# Patient Record
Sex: Female | Born: 1940 | Race: White | Hispanic: No | Marital: Married
Health system: Midwestern US, Academic
[De-identification: ages and names within clinical notes are randomized; demographics above are authoritative.]

---

## 2017-01-30 ENCOUNTER — Encounter: Admit: 2017-01-30 | Discharge: 2017-01-30 | Payer: MEDICARE

## 2017-01-30 DIAGNOSIS — Z1239 Encounter for other screening for malignant neoplasm of breast: Principal | ICD-10-CM

## 2017-02-10 ENCOUNTER — Encounter: Admit: 2017-02-10 | Discharge: 2017-02-10 | Payer: MEDICARE

## 2017-02-10 ENCOUNTER — Ambulatory Visit: Admit: 2017-02-10 | Discharge: 2017-02-10 | Payer: MEDICARE

## 2017-02-10 DIAGNOSIS — Z1231 Encounter for screening mammogram for malignant neoplasm of breast: Principal | ICD-10-CM

## 2018-02-15 ENCOUNTER — Ambulatory Visit: Admit: 2018-02-15 | Discharge: 2018-02-15 | Payer: MEDICARE

## 2018-02-15 DIAGNOSIS — Z1231 Encounter for screening mammogram for malignant neoplasm of breast: Principal | ICD-10-CM

## 2020-03-10 ENCOUNTER — Encounter: Admit: 2020-03-10 | Discharge: 2020-03-10 | Payer: MEDICARE

## 2020-03-10 DIAGNOSIS — Z1231 Encounter for screening mammogram for malignant neoplasm of breast: Secondary | ICD-10-CM

## 2020-03-30 ENCOUNTER — Encounter: Admit: 2020-03-30 | Discharge: 2020-03-30 | Payer: MEDICARE

## 2020-03-30 ENCOUNTER — Ambulatory Visit: Admit: 2020-03-30 | Discharge: 2020-03-30 | Payer: MEDICARE

## 2020-03-30 DIAGNOSIS — Z1231 Encounter for screening mammogram for malignant neoplasm of breast: Secondary | ICD-10-CM

## 2021-05-03 ENCOUNTER — Encounter: Admit: 2021-05-03 | Discharge: 2021-05-03 | Payer: MEDICARE

## 2021-05-04 ENCOUNTER — Encounter: Admit: 2021-05-04 | Discharge: 2021-05-04 | Payer: MEDICARE

## 2021-05-04 ENCOUNTER — Ambulatory Visit: Admit: 2021-05-04 | Discharge: 2021-05-04 | Payer: MEDICARE

## 2021-05-04 DIAGNOSIS — Z1231 Encounter for screening mammogram for malignant neoplasm of breast: Secondary | ICD-10-CM

## 2021-05-07 ENCOUNTER — Encounter: Admit: 2021-05-07 | Discharge: 2021-05-07 | Payer: MEDICARE

## 2021-07-20 ENCOUNTER — Encounter: Admit: 2021-07-20 | Discharge: 2021-07-20 | Payer: MEDICARE

## 2021-07-20 NOTE — Progress Notes
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 No rupture.   Labs:  pending  Meds:  Zosyn. Fluids.   Vitals:  169/85  80  20  99.0  99%RA  AOx4   Hx:  DJD.  RLE lymphedema     Reason for Transfer:  No surgeon in house. Needs IR vs surgery.  [facility] at capacity.

## 2022-05-26 ENCOUNTER — Ambulatory Visit: Admit: 2022-05-26 | Discharge: 2022-05-26 | Payer: MEDICARE

## 2022-05-26 ENCOUNTER — Encounter: Admit: 2022-05-26 | Discharge: 2022-05-26 | Payer: MEDICARE

## 2022-05-26 DIAGNOSIS — Z1231 Encounter for screening mammogram for malignant neoplasm of breast: Secondary | ICD-10-CM

## 2022-06-13 ENCOUNTER — Encounter: Admit: 2022-06-13 | Discharge: 2022-06-13 | Payer: MEDICARE

## 2022-06-14 ENCOUNTER — Encounter: Admit: 2022-06-14 | Discharge: 2022-06-14 | Payer: MEDICARE

## 2022-06-14 DIAGNOSIS — R0989 Other specified symptoms and signs involving the circulatory and respiratory systems: Secondary | ICD-10-CM

## 2022-06-14 DIAGNOSIS — I10 Essential (primary) hypertension: Secondary | ICD-10-CM

## 2022-06-14 DIAGNOSIS — E785 Hyperlipidemia, unspecified: Secondary | ICD-10-CM

## 2022-06-14 DIAGNOSIS — I4891 Unspecified atrial fibrillation: Secondary | ICD-10-CM

## 2022-06-14 DIAGNOSIS — I890 Lymphedema, not elsewhere classified: Secondary | ICD-10-CM

## 2022-06-14 MED ORDER — BUMETANIDE 1 MG PO TAB
1 mg | ORAL_TABLET | Freq: Two times a day (BID) | ORAL | 1 refills | Status: AC
Start: 2022-06-14 — End: ?

## 2022-06-14 NOTE — Progress Notes
Cardiovascular Medicine       Date of Service: 06/14/2022      HPI     Susan Bailey is a 82 y.o. female who was seen today in the Cardiovascular Medicine Clinic at [facility] at our [place] office.     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    She presents for evaluation of recently diagnosed atrial fibrillation and bilateral lower extremity edema.  She states that she was in her usual state of health until Christmas when she developed COVID.  She has noted worsening bilateral lower extremity edema and a 10 pound weight gain in the past few weeks.  She went to her primary care physician who ordered an EKG and it showed atrial fibrillation.  She was prescribed furosemide and apixaban.  She also takes carvedilol and losartan.    She denies any palpitations, lightheadedness, orthopnea, paroxysmal nocturnal dyspnea.  She does have significant bilateral lower extremity edema and her right leg is much more edematous than her left leg.  She reports that she had cellulitis in her right leg few years back and she has had significant lower extremity swelling in that leg since.  She does not report any significant shortness of breath but does state [facility] in the past few weeks.  She has significant bilateral knee arthritis and this limits her activity.  She does not have any known history of cardiac disease.  She had a coronary artery calcium score in October 2023 that was normal.  She reports that she has EKGs last year during surgeries in May and June and atrial fibrillation was not detected.                   Assessment & Plan   82 y.o. female patient with the following medical problems:    Atrial fibrillation.  Hypertension.  Hyperlipidemia.  Lower extremity edema.    Her atrial fibrillation is rate controlled on carvedilol.  Will continue same.  Continue apixaban for thromboembolic stroke prevention.  Will obtain echocardiogram.  Will have her return to clinic in 4 weeks.  I discussed with her the indication for pursuing rhythm control, especially if she has heart failure.  She started anticoagulation only a few days back so if he were to pursue cardioversion, would recommend waiting 3 to 4 weeks, given that she is stable.  At her clinic visit in 4 weeks, we will discuss possible cardioversion fondled by antiarrhythmic initiation based on the results of the echocardiogram.  Will discontinue Lasix as patient states that she does not diurese significantly with this.  Will switch to Bumex 1 mg twice daily.  Will recheck comprehensive metabolic panel in 1 week.  Patient had mild elevation in AST and ALT at prior check, this may be secondary to congestive hepatopathy, will reassess after diuresis and if significantly elevated, will refer to primary care physician so a liver ultrasound can be arranged.  Will arrange for sleep study to evaluate for any obstructive sleep apnea.    Return to clinic in 4 weeks         Past Medical History  Patient Active Problem List    Diagnosis Date Noted    Hyperlipidemia, unspecified 06/14/2022    Hypertension 06/14/2022    Lymphedema 06/14/2022    Other chronic pain  06/14/2022    Vitamin D deficiency, unspecified 06/14/2022    Atrial fibrillation (HCC) 06/09/2022    Other screening mammogram 05/25/2007       I reviewed and confirmed this patient's problem list, active medications, allergies, and past medical, social, family & tobacco histories.     Review of Systems  14 point review of systems negative except as above.    Vitals:    06/14/22 1040   BP: 132/74   BP Source: Arm, Left Upper   Pulse: 75   SpO2: 98%   O2 Device: None (Room air)   PainSc: Zero   Weight: 82.1 kg (181 lb)   Height: 167.6 cm (5' 6)     Body mass index is 29.21 kg/m?Marland Kitchen Physical Exam  General Appearance: no acute distress  HEENT: EOMI, mucous membranes moist, oropharynx is clear  Neck Veins: Regular venous distention 6 cm above the suprasternal notch.    Carotid Arteries: no bruits  Chest Inspection: chest is normal in appearance  Auscultation/Percussion: lungs clear to auscultation, no rales, rhonchi, or wheezing  Cardiac Rhythm: Irregularly irregular.   Murmurs: no cardiac murmurs  Abdominal Exam: soft, non-tender, normal bowel sounds, no masses or bruits  Abdominal aorta: nonpalpable   Liver & Spleen: no organomegaly  Extremities: 4+ pitting edema in the right lower extremity, 3+ pitting edema in the left lower extremity.  Skin: warm & intact  Neurologic Exam: oriented to time, place and person; no focal neurologic deficits       Cardiovascular Studies        Cardiovascular Health Factors  Vitals BP Readings from Last 3 Encounters:   06/14/22 132/74     Wt Readings from Last 3 Encounters:   06/14/22 82.1 kg (181 lb)   05/26/22 81.2 kg (179 lb)     BMI Readings from Last 3 Encounters:   06/14/22 29.21 kg/m?   05/26/22 28.89 kg/m?      Smoking Social History     Tobacco Use   Smoking Status Never   Smokeless Tobacco Never      Lipid Profile Cholesterol   Date Value Ref Range Status   01/18/2022 195  Final     HDL   Date Value Ref Range Status   01/18/2022 66  Final     LDL   Date Value Ref Range Status   01/18/2022 116 (H) <100 Final     Triglycerides   Date Value Ref Range Status   01/18/2022 66  Final      Blood Sugar No results found for: HGBA1C  Glucose   Date Value Ref Range Status   06/13/2022 88  Final        ASCVD Risk Assessment:     ASCVD 10-year risk calculated: The 10-year ASCVD risk score (Arnett DK, et al., 2019) is: 33.7%*    Values used to calculate the score:      Age: 41 years      Sex: Female      Is Non-Hispanic African American: No      Diabetic: No      Tobacco smoker: No      Systolic Blood Pressure: 132 mmHg      Is BP treated: Yes      HDL Cholesterol: 66 mg/dL*      Total Cholesterol: 195 mg/dL*      * - Cholesterol units were assumed for this score calculation     LDL 70-189, if ASCVD 10-y risk is >7.5%, high to moderate-intensity  statin therapy is recommended  Diabetes with ASCVD 10-y risk >7.5%, high-intensity statin therapy is recommended.  Diabetes with ASCVD 10-y risk <7.5%, moderate-intensity statin therapy is recommended.      Current Medications (including today's revisions)   alendronate (FOSAMAX) 70 mg tablet Take one tablet by mouth every 7 days.    carvediloL (COREG) 6.25 mg tablet Take one tablet by mouth twice daily.    CHOLEcalciferoL (vitamin D3) (OPTIMAL D3) 50,000 units capsule Take one capsule by mouth every 7 days.    ELIQUIS 5 mg tablet Take one tablet by mouth twice daily.    estradioL (CLIMARA) 0.025 mg/24 hr patch Apply one patch to top of skin as directed every 7 days.    furosemide (LASIX) 20 mg tablet Take two tablets by mouth twice daily.    Geriatric Multivit w/Iron-Min tab Take 1 tablet by mouth daily.    losartan (COZAAR) 50 mg tablet Take one tablet by mouth daily.    potassium chloride (KLOR-CON 10) 10 mEq tablet Take one tablet by mouth twice daily.    sodium chloride (SEA MIST) 0.65 % nasal spray Apply two sprays into nose as directed every 4 hours as needed.         Orpah Cobb MD  Cardiovascular Medicine.

## 2022-06-14 NOTE — Patient Instructions
Sleep study  Bumex 1 mg twice daily 7am 2pm  Lab in one week   Echo    Follow up as directed.  Call sooner if issues.  Call the [place] line at [phone].  Leave a detailed message for the nurse in [place] with how we can assist you and we will call you back.

## 2022-06-20 ENCOUNTER — Encounter: Admit: 2022-06-20 | Discharge: 2022-06-20 | Payer: MEDICARE

## 2022-06-20 MED ORDER — BUMETANIDE 1 MG PO TAB
1 mg | ORAL_TABLET | Freq: Two times a day (BID) | ORAL | 1 refills | Status: AC
Start: 2022-06-20 — End: ?

## 2022-06-20 NOTE — Telephone Encounter
06/20/22 @ 1136am- Call placed to pt, verified name and DOB. Reviewed the below results/recommendations from Dr. Cephus Slater, pt states she has a lab order entered to be done this week at [facility], with chart review noted CMP was ordered. Instructed pt to call office if weight increase and she increases Bumex dose so medication list can be updated, pt v/u and denies any further questions/concerns at this time.         ===View-only below this line===  ----- Message -----  From: Lurline Del, MD  Sent: 06/20/2022   9:55 AM CST  To: Luretha Murphy, RN; *    If her breathing is stable, we can continue to monitor on the current dose of Bumex.  She has lost about 5 to 6 pounds.  Please obtain a repeat BMP.  If weight continues to go up instead of staying stable, she can take 2 mg of Bumex in the morning and 1 mg in the afternoon for a total of 3 mg daily.    Thanks.  Swathi

## 2022-06-20 NOTE — Telephone Encounter
06/20/22 @ 0906am- Received VM on nurse line from pt requesting a return call back to discuss bumex and potassium that she was started on by Dr. Cephus Slater and she is wondering if she still needs to take the medication as her swelling as improved.    06/20/22 @ 0930am- Call placed to pt, verified name and DOB. Reviewed VM received and obtained the below weight log, pt reports that she felt Bumex was working however states her weight is going back up, so she is questioning if medication needs to be adjusted, instructed pt that information will be routed to Dr. Cephus Slater for review/advise and will return call with further recommendations, pt v/u and denies any further questions/concerns at this time.     06/14/22- 181 lbs  06/15/22-176.6 lbs  06/16/22- 176.2 lbs  06/17/22- 176.2 bs   06/18/22- 177.4 lbs  06/19/22- not taken   06/20/22- 178.2 lbs       Routed to Dr. Cephus Slater and Kathe Mariner nurse team.

## 2022-06-23 ENCOUNTER — Ambulatory Visit: Admit: 2022-06-23 | Discharge: 2022-06-23 | Payer: MEDICARE

## 2022-06-23 ENCOUNTER — Encounter: Admit: 2022-06-23 | Discharge: 2022-06-23 | Payer: MEDICARE

## 2022-06-23 DIAGNOSIS — I10 Essential (primary) hypertension: Secondary | ICD-10-CM

## 2022-06-23 DIAGNOSIS — R0989 Other specified symptoms and signs involving the circulatory and respiratory systems: Secondary | ICD-10-CM

## 2022-06-23 DIAGNOSIS — E785 Hyperlipidemia, unspecified: Secondary | ICD-10-CM

## 2022-06-23 DIAGNOSIS — I4891 Unspecified atrial fibrillation: Secondary | ICD-10-CM

## 2022-06-23 DIAGNOSIS — I890 Lymphedema, not elsewhere classified: Secondary | ICD-10-CM

## 2022-06-23 LAB — COMPREHENSIVE METABOLIC PANEL
ALBUMIN: 3.5
ALK PHOSPHATASE: 40
ALT: 31
ANION GAP: 13
AST: 17
BLD UREA NITROGEN: 29 — ABNORMAL HIGH (ref 9.8–20.1)
CALCIUM: 8.9
CHLORIDE: 103
CO2: 25
CREATININE: 0.9
GLUCOSE,PANEL: 105
POTASSIUM: 4.1
SODIUM: 141
TOTAL BILIRUBIN: 0.6
TOTAL PROTEIN: 6 — ABNORMAL LOW (ref 6.2–8.1)

## 2022-06-24 ENCOUNTER — Encounter: Admit: 2022-06-24 | Discharge: 2022-06-24 | Payer: MEDICARE

## 2022-06-24 NOTE — Telephone Encounter
Lurline Del, MD  Baldwin Crown, RN  Caller: Unspecified (Today,  9:35 AM)  Thank you for letting me know.  Please let her know I hope she gets over her diverticulitis soon.  No new changes for her diuretic regimen, I agree with her taking additional doses of Bumex as needed to maintain a weight between 170 to 175 pounds.  Please have her call us if she has any worsening shortness of breath/swelling/weight gain that is not responding to diuresis.

## 2022-06-24 NOTE — Telephone Encounter
Susan Bailey called into the nursing line to report that she took an extra dose of bumex this morning.  Susan Bailey states that her weight went down to 176 on 2/2.  On 2/3 she started having a flare up of her diverticulitis.  He was was up, but by 2/6, her weight was back down to 174.8.  Her weight today is and yesterday was up to 175.2 and so she took an extra bumex this morning to try and help get her weight back down to her target.    Susan Bailey states that she is feeling okay overall.  She is still dealing with the diverticulitis, but has been working with her PCP and is trying to get back to her normal diet.  She states that her swelling has improved - especially in the morning, but she will have some swelling in the evening after taking off her compression socks.  Her BP is running around 117.89, HR around 97.    Will route to [facility] for review and recommendations.

## 2022-06-27 ENCOUNTER — Encounter: Admit: 2022-06-27 | Discharge: 2022-06-27 | Payer: MEDICARE

## 2022-06-27 NOTE — Telephone Encounter
Sent mychart message with results and recommendations.

## 2022-06-27 NOTE — Telephone Encounter
-----   Message from Trenton Founds, [place] sent at 06/27/2022 12:40 PM CST -----    ----- Message -----  From: Lurline Del, MD  Sent: 06/27/2022  12:38 PM CST  To: Asencion Noble; Cvm Nurse Gen Card Team Gold    Please let her know that her kidney and liver function tests were within normal range.   No new medication recommendations at this time.     Thanks  Swathi  ----- Message -----  From: Asencion Noble  Sent: 06/23/2022   2:02 PM CST  To: Lurline Del, MD    Labs for your review and recommendations.

## 2022-07-07 ENCOUNTER — Encounter: Admit: 2022-07-07 | Discharge: 2022-07-07 | Payer: MEDICARE

## 2022-07-07 DIAGNOSIS — I10 Essential (primary) hypertension: Secondary | ICD-10-CM

## 2022-07-07 DIAGNOSIS — R0989 Other specified symptoms and signs involving the circulatory and respiratory systems: Secondary | ICD-10-CM

## 2022-07-07 DIAGNOSIS — B999 Unspecified infectious disease: Secondary | ICD-10-CM

## 2022-07-07 DIAGNOSIS — I4891 Unspecified atrial fibrillation: Secondary | ICD-10-CM

## 2022-07-07 DIAGNOSIS — Z9229 Personal history of other drug therapy: Secondary | ICD-10-CM

## 2022-07-07 DIAGNOSIS — E785 Hyperlipidemia, unspecified: Secondary | ICD-10-CM

## 2022-07-07 DIAGNOSIS — M1990 Unspecified osteoarthritis, unspecified site: Secondary | ICD-10-CM

## 2022-07-07 DIAGNOSIS — I890 Lymphedema, not elsewhere classified: Secondary | ICD-10-CM

## 2022-07-07 DIAGNOSIS — Z780 Asymptomatic menopausal state: Secondary | ICD-10-CM

## 2022-07-07 NOTE — Patient Instructions
Thank you for visiting our office today.    We would like to make the following medication adjustments:  None       Otherwise continue the same medications as you have been doing.          We will be pursuing the following tests after your appointment today:       Orders Placed This Encounter    ECG 12-LEAD     Tikosyn Admission    We will plan to see you back in 3 months.  Please call us in the meantime with any questions or concerns.        Please allow 5-7 business days for our providers to review your results. All normal results will go to MyChart. If you do not have Mychart, it is strongly recommended to get this so you can easily view all your results. If you do not have mychart, we will attempt to call you once with normal lab and testing results. If we cannot reach you by phone with normal results, we will send you a letter.  If you have not heard the results of your testing after one week please give [place] a call.       Your Cardiovascular Medicine [place] Team Richardson Landry, Rene Kocher, Threasa Beards, and [place])  phone number is [phone].

## 2022-07-07 NOTE — Progress Notes
Cardiovascular Medicine       Date of Service: 07/07/2022      HPI     Susan Bailey is a 82 y.o. female who was seen today in the Cardiovascular Medicine Clinic at [facility] at our [place] office.     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     She presented for evaluation of recently diagnosed atrial fibrillation and bilateral lower extremity edema.  She states that she was in her usual state of health until Christmas when she developed COVID.  She has noted worsening bilateral lower extremity edema and a 10 pound weight gain in the past few weeks.  She went to her primary care physician who ordered an EKG and it showed atrial fibrillation.  She was prescribed furosemide and apixaban.  She also takes carvedilol and losartan.     She denies any palpitations, lightheadedness, orthopnea, paroxysmal nocturnal dyspnea.  She does have significant bilateral lower extremity edema and her right leg is much more edematous than her left leg.  She reports that she had cellulitis in her right leg few years back and she has had significant lower extremity swelling in that leg since.  She does not report any significant shortness of breath but does state [facility] in the past few weeks.  She has significant bilateral knee arthritis and this limits her activity.  She does not have any known history of cardiac disease.  She had a coronary artery calcium score in October 2023 that was normal.  She reports that she has EKGs last year during surgeries in May and June and atrial fibrillation was not detected.    She continues to be in atrial fibrillation today.  She states that she has noticed a slight increase in her fatigue and exertional dyspnea for the past few weeks.  She has tolerated the apixaban well without any significant bleeding.  I discussed a cardioversion with her with initiation of antiarrhythmic after that.  She and her husband are agreeable.           ECG: Atrial fibrillation.    Transthoracic echocardiogram 06/2022:  Normal left ventricular cavitary dimensions with concentric remodeling  Normal left ventricular systolic function with a visually estimated ejection fraction of ~55-60 %.  Normal right ventricular cavitary size with normal RV systolic function.  Unable to determine LV diastolic dysfunction secondary to underlying atrial fibrillation.  No left ventricular focal regional wall motion abnormality.  No chamber enlargement.  No hemodynamically significant valvular stenosis  Mild mitral, aortic and moderate tricuspid valve insufficiency.  Estimated PA systolic pressure is 28 mmHg  No pericardial effusion.     Stress test:        Assessment & Plan   82 y.o. female patient with the following medical problems:    Atrial fibrillation.  Hypertension.  Hyperlipidemia.  Lower extremity edema.     ECG continues to show atrial fibrillation. Continue apixaban for thromboembolic stroke prevention.  Continue carvedilol, losartan and supplemental potassium.  Transthoracic echocardiogram showed preserved left ventricular systolic function.  Will plan for cardioversion followed by dofetilide initiation.  Discussed the procedure with the patient and she is agreeable to the same.  Continue diuresis with Bumex.  Please clinic visit had some mild elevation in AST and ALT but this has since resolved.  Her TSH was normal.  Will arrange for sleep study to evaluate for any obstructive sleep apnea.     Return to clinic in 3 months.         Past Medical History  Patient Active Problem List    Diagnosis Date Noted    Hyperlipidemia, unspecified 06/14/2022    Hypertension 06/14/2022    Lymphedema 06/14/2022    Other chronic pain 06/14/2022    Vitamin D deficiency, unspecified 06/14/2022    Atrial fibrillation (HCC) 06/09/2022    Other screening mammogram 05/25/2007       I reviewed and confirmed this patient's problem list, active medications, allergies, and past medical, social, family & tobacco histories.     Review of Systems  14 point review of systems negative except as above.    There were no vitals filed for this visit.  There is no height or weight on file to calculate BMI.     Physical Exam  General Appearance: no acute distress  HEENT: EOMI, mucous membranes moist, oropharynx is clear  Neck Veins: neck veins are flat & not distended  Carotid Arteries: no bruits  Chest Inspection: chest is normal in appearance  Auscultation/Percussion: lungs clear to auscultation, no rales, rhonchi, or wheezing  Cardiac Rhythm: regular rhythm & normal rate  Cardiac Auscultation: Normal S1 & S2, no S3 or S4, no rub  Murmurs: no cardiac murmurs  Abdominal Exam: soft, non-tender, normal bowel sounds, no masses or bruits  Abdominal aorta: nonpalpable   Liver & Spleen: no organomegaly  Extremities: no lower extremity edema; palpable distal pulses  Skin: warm & intact  Neurologic Exam: oriented to time, place and person; no focal neurologic deficits       Cardiovascular Studies  06/23/22   2D + DOPPLER ECHO   Result Value Ref Range    BSA 1.86 m2    Referring Provider Voshon Petro     LVIDD 4.4 3.8 - 5.2 cm    LVIDS 2.5 2.2 - 3.5 cm    IVS 1.0 0.6 - 0.9 cm    PW 1.0 0.6 - 0.9 cm    FS 43.18 28 - 44 %    Teich[place]oltz 73.41 %    LA size 3.9 2.7 - 3.8 cm    Left Ventricle Systolic Volume 30 14 - 42 mL    Left Ventricle Systolic Volume Index 16 8 - 24 mL/m2    Left Ventricle Diastolic Volume 65 46 - 106 mL    Left Ventricle Diastolic Volume Index 35 29 - 61 mL/m2    LA volume 61 22 - 52 mL    Left Atrium Index 32.80 16 - 34 mL/m2    LV mass 148 67 - 162 g    Left Ventricle Mass Index 79 43 - 95 g/m2    RWT 0.45 <=0.42    Right Ventricular Basal Diameter 4.2 2.5 - 4.1 cm    Right Atrial Area 18.0 <18 cm2    Right Ventricular Mid Diameter 2.6 1.9 - 3.5 cm    Right Heart Systolic Mmode TAPSE 1.6 >1.7 cm    Sinus 2.7 2.4 - 3.6 cm    Proximal aorta 2.9 1.9 - 3.5 cm    LVOT diameter 1.9 cm    LVOT area 2.84 cm2    LVOT peak VTI 18.0 cm    LVOT stroke volume 51.04 cm3    MV Comp diameter 1.9 cm    MV Comp area 2.84 cm2    MV Incomp  diameter 3.2 cm    MV Incomp area 8.04 cm2    TR PEAK VELOCITY 2.5 m/s    RV SYSTOLIC PRESSURE 25     RA PRESSURE 3     TV rest pulmonary artery pressure 28 mmHg    TDI Medial e' 0.080 m/s    Medial E/E' ratio 12.50     TDI lateral e' 0.120 m/s    Lateral E/E' ratio 8.33     MV Peak E Vel PW 1.000 m/s    Right Heart Systolic TDI S' 0.1 m/s    SIMPSON'S BIPLANE EF 54 %    ECHO EF 55 %        Cardiovascular Health Factors  Vitals BP Readings from Last 3 Encounters:   06/23/22 125/66   06/14/22 132/74     Wt Readings from Last 3 Encounters:   06/23/22 82.1 kg (181 lb)   06/14/22 82.1 kg (181 lb)   05/26/22 81.2 kg (179 lb)     BMI Readings from Last 3 Encounters:   06/23/22 35.35 kg/m?   06/14/22 29.21 kg/m?   05/26/22 28.89 kg/m?      Smoking Social History     Tobacco Use   Smoking Status Never   Smokeless Tobacco Never      Lipid Profile Cholesterol   Date Value Ref Range Status   01/18/2022 195  Final     HDL   Date Value Ref Range Status   01/18/2022 66  Final     LDL   Date Value Ref Range Status   01/18/2022 116 (H) <100 Final     Triglycerides   Date Value Ref Range Status   01/18/2022 66  Final      Blood Sugar No results found for: HGBA1C  Glucose   Date Value Ref Range Status   06/23/2022 105  Final   06/13/2022 88  Final        ASCVD Risk Assessment:     ASCVD 10-year risk calculated: The ASCVD Risk score (Arnett DK, et al., 2019) failed to calculate for the following reasons:    The 2019 ASCVD risk score is only valid for ages 44 to 65     LDL 70-189, if ASCVD 10-y risk is >7.5%, high to moderate-intensity statin therapy is recommended  Diabetes with ASCVD 10-y risk >7.5%, high-intensity statin therapy is recommended.  Diabetes with ASCVD 10-y risk <7.5%, moderate-intensity statin therapy is recommended.      Current Medications (including today's revisions)   alendronate (FOSAMAX) 70 mg tablet Take one tablet by mouth every 7 days.    bumetanide (BUMEX) 1 mg tablet Take one tablet by mouth twice daily. If weight continues to increase may take an additional 1 mg in AM so total of 2 mg in AM and 1 mg in afternoon for total daily dose of 3 mg    carvediloL (COREG) 6.25 mg tablet Take one tablet by mouth twice daily.    CHOLEcalciferoL (vitamin D3) (OPTIMAL D3) 50,000 units capsule Take one capsule by mouth every 7 days.    ELIQUIS 5 mg tablet Take one tablet by mouth twice daily.    estradioL (CLIMARA) 0.025 mg/24 hr patch Apply one patch to top of skin as directed every 7 days.    Geriatric Multivit w/Iron-Min tab Take 1 tablet by mouth daily.    losartan (COZAAR) 50 mg tablet Take one tablet by mouth daily.    potassium chloride (KLOR-CON 10) 10 mEq tablet Take one tablet  by mouth twice daily.    sodium chloride (SEA MIST) 0.65 % nasal spray Apply two sprays into nose as directed every 4 hours as needed.         Orpah Cobb MD  Cardiovascular Medicine.

## 2022-07-11 ENCOUNTER — Encounter: Admit: 2022-07-11 | Discharge: 2022-07-11 | Payer: MEDICARE

## 2022-07-11 DIAGNOSIS — I10 Essential (primary) hypertension: Secondary | ICD-10-CM

## 2022-07-11 DIAGNOSIS — I4891 Unspecified atrial fibrillation: Secondary | ICD-10-CM

## 2022-07-11 DIAGNOSIS — E785 Hyperlipidemia, unspecified: Secondary | ICD-10-CM

## 2022-07-11 NOTE — Telephone Encounter
Called patient's pharmacy to check price of Tikosyn through patient's insurance. Price will be $8.47/month. Confirmed cost with patient. Patient agreable to proceed with [place] admission.

## 2022-07-12 ENCOUNTER — Encounter: Admit: 2022-07-12 | Discharge: 2022-07-12 | Payer: MEDICARE

## 2022-07-12 NOTE — Progress Notes
Medicare is listed as patient's primary insurance coverage.  Pre-certification is not required for hospitalizations.

## 2022-07-13 ENCOUNTER — Encounter: Admit: 2022-07-13 | Discharge: 2022-07-13 | Payer: MEDICARE

## 2022-07-13 NOTE — Progress Notes
Report Sheet    DCCV           Indication: Afib/TIKOSYN ADMIT  Ordering Provider: Laverda Sorenson, MD    Name: Susan Bailey     Age: 82 y.o.    DOB: 07/07/1940     MRN: [id number]    RM #: HC%     RN:     Phone #:     Isolation:     Communication Barrier: none noted    NPO    A&O    Travel    IV    O2    Additional Information:     Lab(s) needed: See inpt lab values; EKG  Other:   Device check needed: No    Device: No results found for: GENERATOR, EPDEVTYP    Prior TEE: None at [facility]     Prior DCCV:      Prior Echo:06/23/22   Previous TEE/DCCV details:     EF:   ECHO EF   Date Value Ref Range Status   06/23/2022 55 % Final       Anticoag:Eliquis     Dose:5mg      Frequency:BID     Last Dose:     Missed:    LAB:   Lab Results   Component Value Date/Time    HGB 13.9 06/18/2022 12:00 AM    PLTCT 237 06/18/2022 12:00 AM    GLU 105 06/23/2022 12:00 AM    NA 141 06/23/2022 12:00 AM    K 4.1 06/23/2022 12:00 AM    CR 0.98 06/23/2022 12:00 AM       Probe   Gastric Surgery    GERD    Swallow difficulty    Head/Neck Surg/Rad    Chest Surg/Rad    EGD    Varices/Esophag CA    GI bleed    Dental issues      Sedation   COPD    OSA/CPAP    Asthma    Pulm HTN    Anesthesia Issues    Smoker NEVER   ETOH & Frequency    Drug Use    Chronic Pain Med    GLP-1 Last dose:     Current Medications:    alendronate (FOSAMAX) 70 mg tablet Take one tablet by mouth every 7 days.    bumetanide (BUMEX) 1 mg tablet Take one tablet by mouth twice daily. If weight continues to increase may take an additional 1 mg in AM so total of 2 mg in AM and 1 mg in afternoon for total daily dose of 3 mg    CALCIUM CITRATE PO Take 600 mg by mouth daily.    carvediloL (COREG) 6.25 mg tablet Take one tablet by mouth twice daily.    CHOLEcalciferoL (vitamin D3) (OPTIMAL D3) 50,000 units capsule Take one capsule by mouth every 7 days.    ELIQUIS 5 mg tablet Take one tablet by mouth twice daily.    estradioL (CLIMARA) 0.025 mg/24 hr patch Apply one patch to top of skin as directed every 7 days.    Geriatric Multivit w/Iron-Min tab Take 1 tablet by mouth daily.    losartan (COZAAR) 50 mg tablet Take one tablet by mouth daily.    potassium chloride (KLOR-CON 10) 10 mEq tablet Take one tablet by mouth twice daily.    sodium chloride (SEA MIST) 0.65 % nasal spray Apply two sprays into nose as directed every 4 hours as needed.       Past Medical/Surgical History:  Patient Active Problem List    Diagnosis Date Noted    Hyperlipidemia, unspecified 06/14/2022    Hypertension 06/14/2022    Lymphedema 06/14/2022    Other chronic pain 06/14/2022    Vitamin D deficiency, unspecified 06/14/2022    Atrial fibrillation (HCC) 06/09/2022    Other screening mammogram 05/25/2007      Past Medical History:   Diagnosis Date    Arthritis 2018    Atrial fibrillation (HCC) 06-09-2022    HTN (hypertension) 08-28-2021    HX: anticoagulation 06-09-2022    Infection 06-18-2022    Diverticulitis  & cystitis    Postmenopausal 1996      Surgical History:   Procedure Laterality Date    ECHOCARDIOGRAM PROCEDURE  06-23-2022    ELECTROCARDIOGRAM  06-09-2022       Allergies:   Allergies   Allergen Reactions    Penicillins RASH and SEE COMMENTS     Happened with Amoxicillin     Sharyon Medicus, RN

## 2022-07-19 IMAGING — CT ABDOMEN_PELVIS W(Adult)
2 of 3 series · 11 of 46 positions shown, 12 images · non-contrast
Comparison: none

[Series 2: abdomen_pelvis ax 3.00 br40 s3 · axial · 0.53mm/px · z∈[+1493,+1835]mm · 8 of 108 slices shown, 9 images]
[im 7/108  soft-tissue]
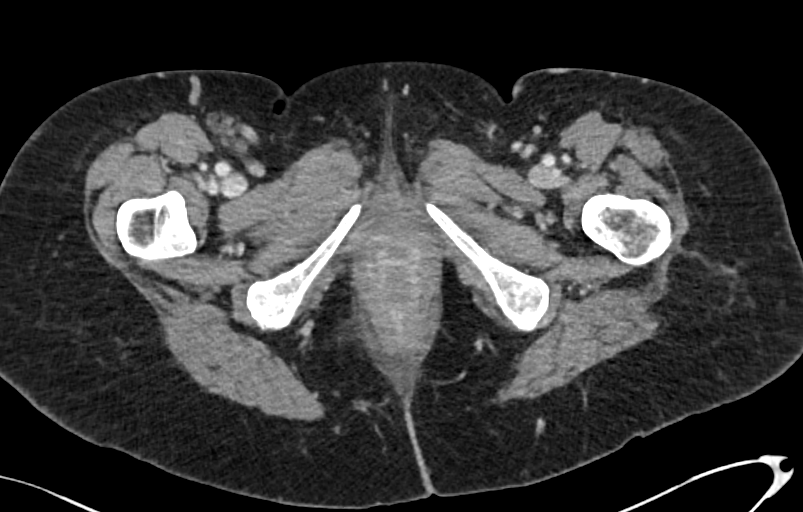
[im 7/108  bone]
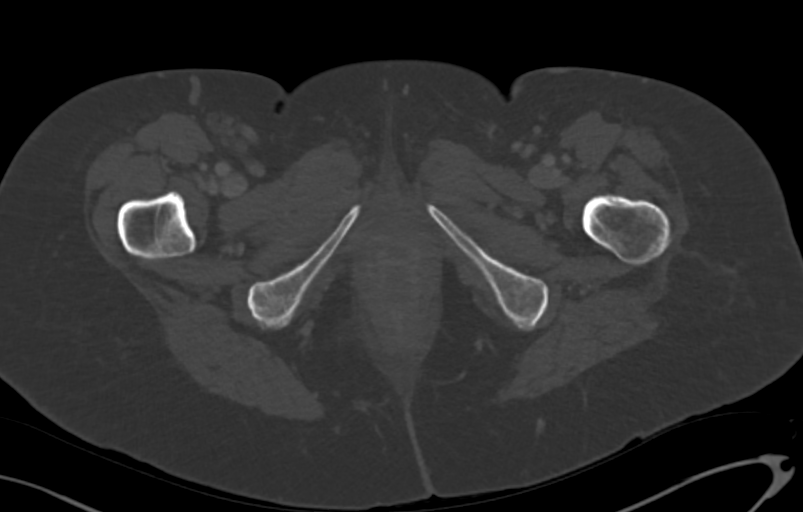
[im 21/108  soft-tissue]
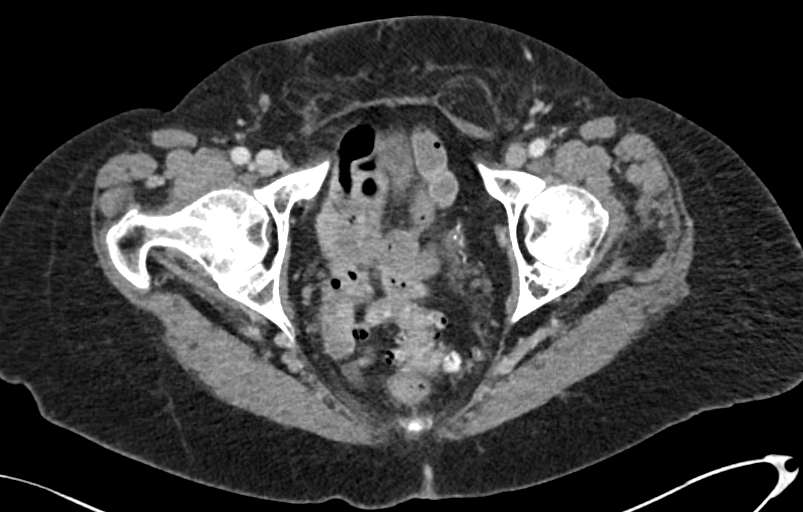
[im 35/108  soft-tissue]
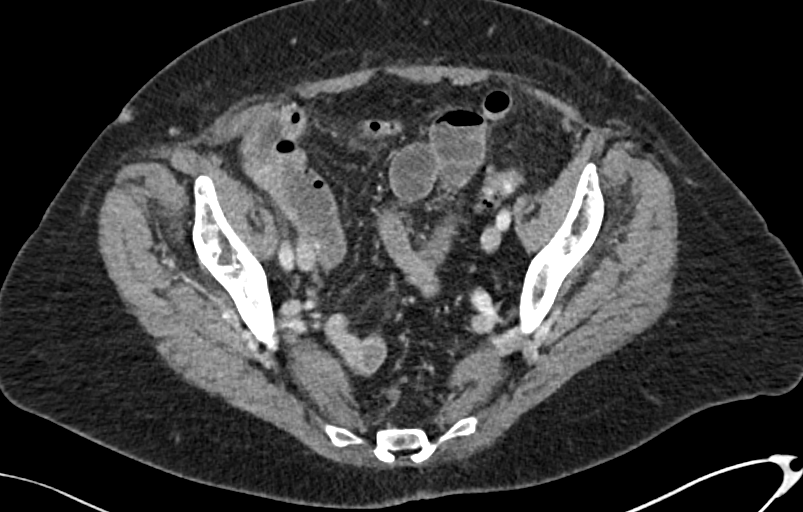
[im 49/108  soft-tissue]
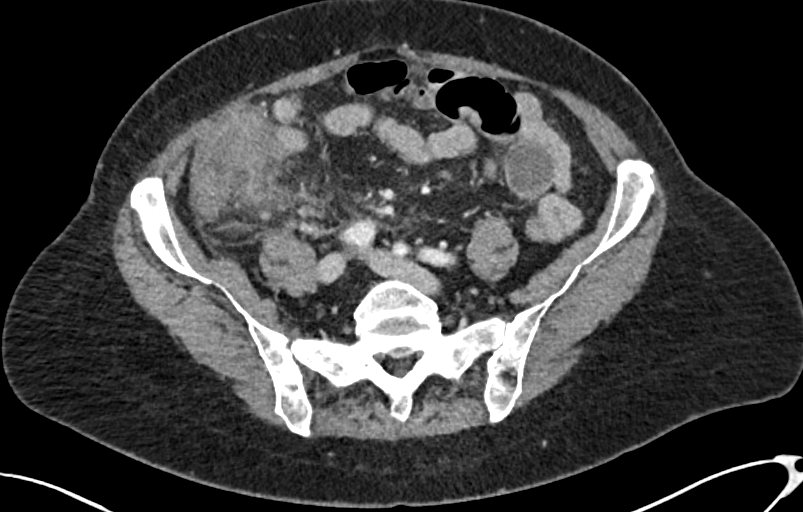
[im 59/108  soft-tissue]
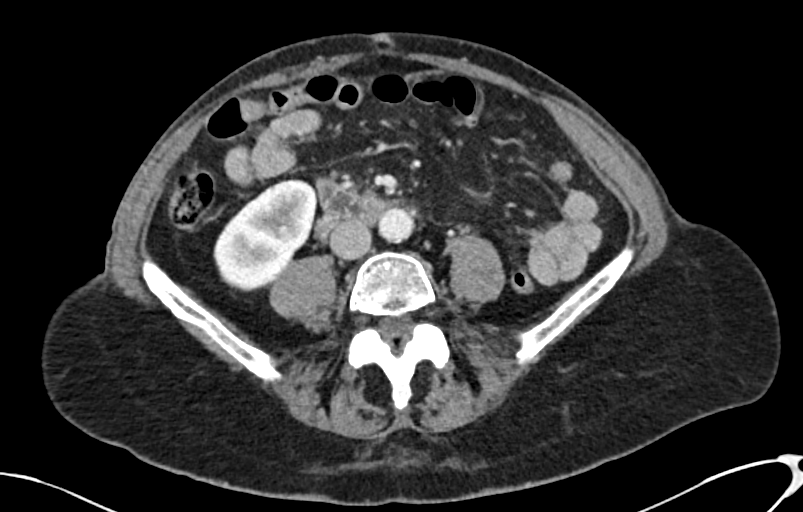
[im 73/108  soft-tissue]
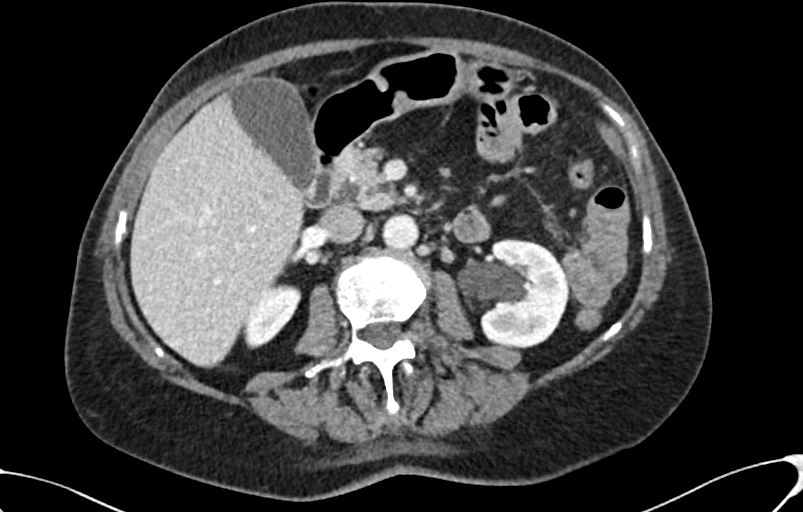
[im 87/108  soft-tissue]
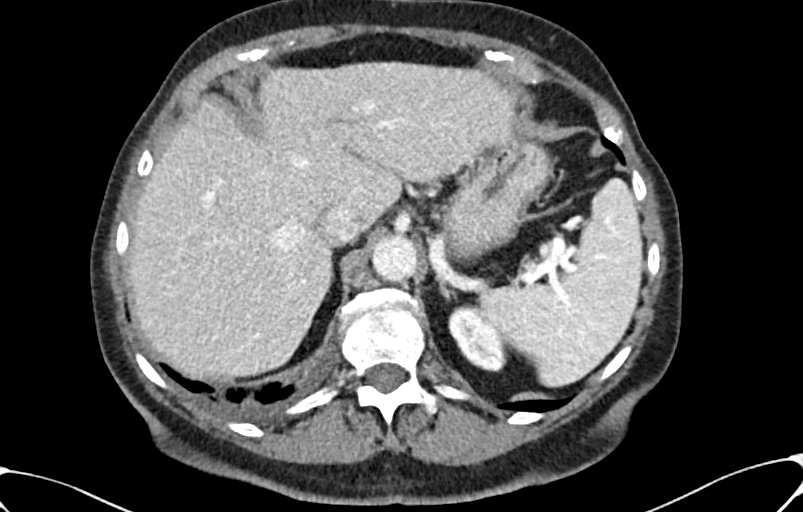
[im 101/108  soft-tissue]
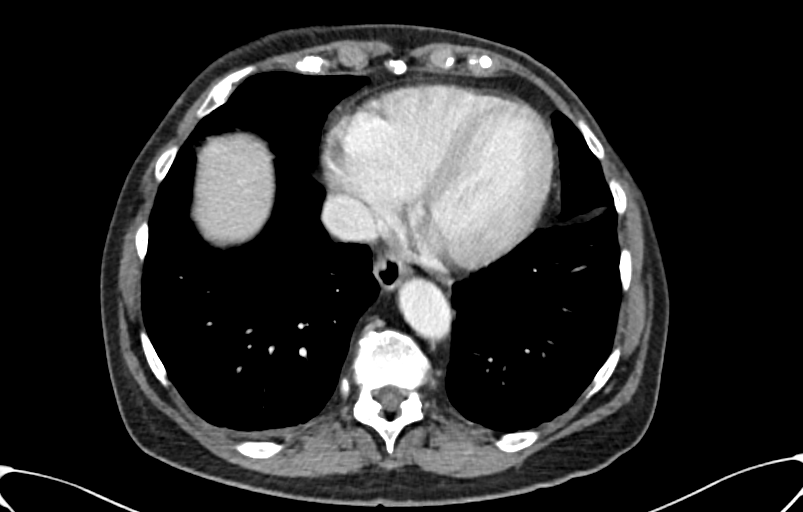

[Series 4: abdomen_pelvis cor 3.00 br40 s3 · coronal · 0.78mm/px · 3 of 74 slices shown]
[im 25/74  soft-tissue]
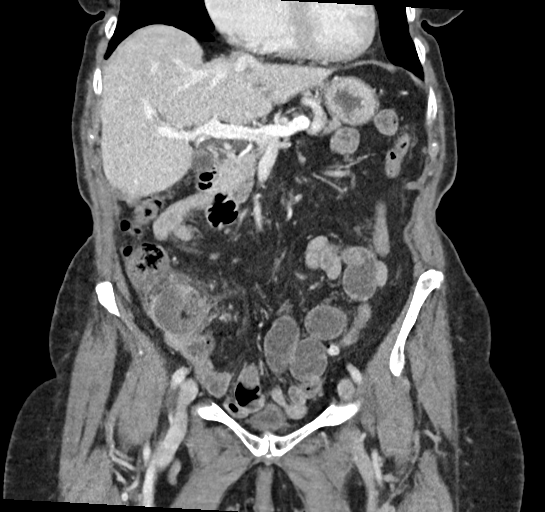
[im 33/74  soft-tissue]
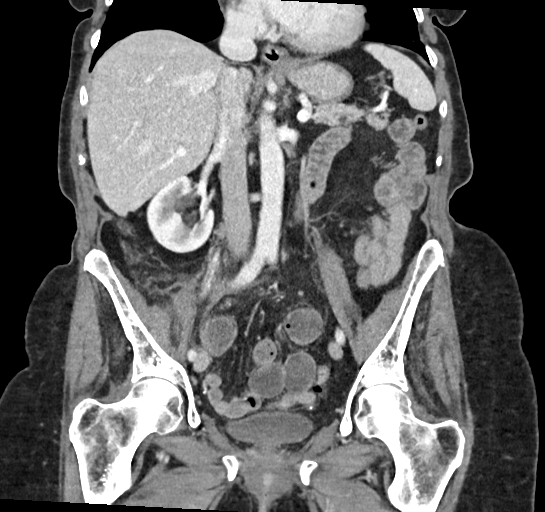
[im 41/74  soft-tissue]
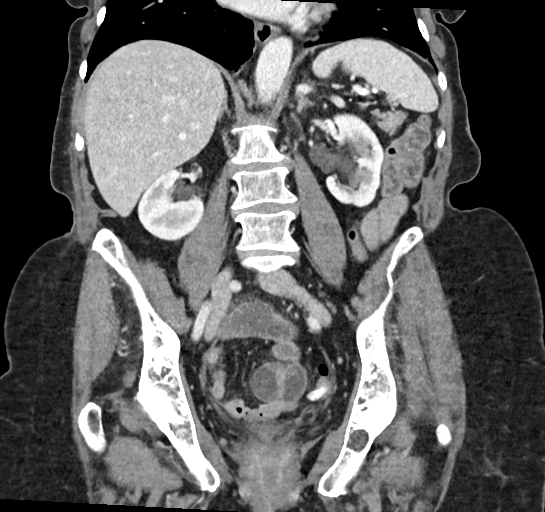

[11 of 46 positions shown; findings below may reference images not displayed]

DIAGNOSTIC STUDIES

EXAM

CT abdomen pelvis with contrast

INDICATION

abdominal pain x 2 wks.
PT STATES HAS RLQ PAIN, THROUGHOUT ABD. HX OF RIGHT HERNIA SURGERY AND HYSTERECTOMY. GFR 61. MNN2E
DKYWQUU. CT/NM 0/0. TJ/HB

TECHNIQUE

All CT scans at this facility use dose modulation, iterative reconstruction, and/or weight based
dosing when appropriate to reduce radiation dose to as low as reasonably achievable.

Number of previous computed tomography exams in the last 12 months is 0 .

Number of previous nuclear medicine myocardial perfusion studies in the last 12 months is 0 .

COMPARISONS

November 09, 2018

FINDINGS

Minimal atelectasis and scarring is evident in the lung bases. Small hepatic cysts are noted.
Gallbladder is unremarkable. Spleen is normal.

Adrenal glands are normal. There are parapelvic renal cysts bilaterally and tiny cortical renal
cysts.

Pancreas is within normal limits. No retroperitoneal adenopathy. Calcified plaque is noted
throughout the abdominal pelvic vasculature.

There is colonic diverticulosis without diverticulitis. Small bowel is normal caliber.

There is a large rim enhancing fluid collection consistent with abscess in the right lower
quadrant. This measures 4.6 cm image 81 series 2. Patient has not had an appendectomy and this is
consistent with a large periappendiceal abscess.

No pelvic adenopathy is seen. Prior hysterectomy.

Fat containing left inguinal hernia is noted with superficial varicosities in the inguinal regions
bilaterally.

Degenerative changes of the spine are noted. No concerning osseous lesions.

IMPRESSION

4.6 cm appendiceal/periappendiceal abscess in the right lower quadrant. Report called Dr. Lack at
[DATE] p.m.. Additional findings as above.

Tech Notes:

PT STATES HAS RLQ PAIN, THROUGHOUT ABD. HX OF RIGHT HERNIA SURGERY AND HYSTERECTOMY. GFR 61. MNN2E
DKYWQUU. CT/NM 0/0. TJ/HB

## 2022-07-21 ENCOUNTER — Inpatient Hospital Stay: Payer: MEDICARE

## 2022-07-22 ENCOUNTER — Encounter: Admit: 2022-07-22 | Discharge: 2022-07-22 | Payer: MEDICARE

## 2022-07-22 NOTE — Telephone Encounter
Patient called to report she was recently started on Flagyl for bacterial vaginosis. She would like to know if there is a contraindication of Flagyl and the Tikosyn she is being admitted to initiate on Monday. Discussed with PGU since [facility] is out of the office. PGU recommends for patient to suspend taking the flagyl as it could cause QT prolongation. Discussed with patient, she verbalizes understanding and agrees with plan of care.

## 2022-07-25 ENCOUNTER — Ambulatory Visit: Admit: 2022-07-25 | Discharge: 2022-07-25 | Payer: MEDICARE

## 2022-07-25 ENCOUNTER — Encounter: Admit: 2022-07-25 | Discharge: 2022-07-25 | Payer: MEDICARE

## 2022-07-25 DIAGNOSIS — I10 Essential (primary) hypertension: Secondary | ICD-10-CM

## 2022-07-25 DIAGNOSIS — I4891 Unspecified atrial fibrillation: Secondary | ICD-10-CM

## 2022-07-25 DIAGNOSIS — E785 Hyperlipidemia, unspecified: Secondary | ICD-10-CM

## 2022-07-25 LAB — CBC
HEMATOCRIT: 37 % (ref 36–45)
HEMOGLOBIN: 12 g/dL (ref 12.0–15.0)
MCH: 31 pg (ref 26–34)
MCHC: 33 g/dL (ref 32.0–36.0)
MCV: 94 FL (ref 80–100)
MPV: 7.8 FL (ref 7–11)
PLATELET COUNT: 344 K/UL (ref 150–400)
RBC COUNT: 3.9 M/UL — ABNORMAL LOW (ref 4.0–5.0)
RDW: 13 % (ref >60)
WBC COUNT: 9.4 K/UL (ref 4.5–11.0)

## 2022-07-25 LAB — BASIC METABOLIC PANEL
POTASSIUM: 4.7 MMOL/L (ref 3.5–5.1)
SODIUM: 138 MMOL/L (ref 137–147)

## 2022-07-25 LAB — MAGNESIUM: MAGNESIUM: 2 mg/dL (ref 1.6–2.6)

## 2022-07-25 MED ADMIN — DOFETILIDE 500 MCG PO CAP [82575]: 500 ug | ORAL | @ 22:00:00 | NDC 69452013317

## 2022-07-26 ENCOUNTER — Observation Stay: Admit: 2022-07-26 | Discharge: 2022-07-26 | Payer: MEDICARE

## 2022-07-26 ENCOUNTER — Ambulatory Visit: Admit: 2022-07-26 | Discharge: 2022-07-26 | Payer: MEDICARE

## 2022-07-26 MED ORDER — SODIUM CHLORIDE 0.9 % IV SOLP (OR) 500ML
INTRAVENOUS | 0 refills | Status: DC
Start: 2022-07-26 — End: 2022-07-26

## 2022-07-26 MED ORDER — PROPOFOL INJ 10 MG/ML IV VIAL
INTRAVENOUS | 0 refills | Status: DC
Start: 2022-07-26 — End: 2022-07-26

## 2022-07-26 MED ORDER — LIDOCAINE (PF) 20 MG/ML (2 %) IJ SOLN
INTRAVENOUS | 0 refills | Status: DC
Start: 2022-07-26 — End: 2022-07-26

## 2022-07-26 MED ADMIN — MAGNESIUM SULFATE IN D5W 1 GRAM/100 ML IV PGBK [166578]: 1 g | INTRAVENOUS | @ 04:00:00 | Stop: 2022-07-27 | NDC 00264440054

## 2022-07-26 MED ADMIN — ACETAMINOPHEN 325 MG PO TAB [101]: 650 mg | ORAL | @ 03:00:00 | NDC 00904677361

## 2022-07-26 MED ADMIN — APIXABAN 5 MG PO TAB [315778]: 5 mg | ORAL | @ 02:00:00 | NDC 00003089431

## 2022-07-26 MED ADMIN — LOSARTAN 50 MG PO TAB [76938]: 50 mg | ORAL | @ 14:00:00 | NDC 65862020290

## 2022-07-26 MED ADMIN — SILVER SULFADIAZINE 1 % TP CREA [7224]: TOPICAL | @ 23:00:00 | NDC 67877012450

## 2022-07-26 MED ADMIN — APIXABAN 5 MG PO TAB [315778]: 5 mg | ORAL | @ 14:00:00 | NDC 00003089431

## 2022-07-26 MED ADMIN — DOFETILIDE 500 MCG PO CAP [82575]: 500 ug | ORAL | @ 14:00:00 | Stop: 2022-07-26 | NDC 69452013317

## 2022-07-26 NOTE — Anesthesia Post-Procedure Evaluation
Post-Anesthesia Evaluation    Name: Susan Bailey      MRN: [id number]     DOB: [birth year]     Age: 82 y.o.     Sex: female   __________________________________________________________________________     Procedure Information       Anesthesia Start Date/Time: 07/26/22 1436    Scheduled providers: Marcelline Mates, MD; Naida Sleight, RN    Procedure: CARDIOVERSION, EXTERNAL    Location: Cardi[facility]: [facility]  BP: 127/56 (03/12 1446)  Pulse: 66 (03/12 1446)  Respirations: 20 PER MINUTE (03/12 1446)  SpO2: 97 % (03/12 1446)  O2 Device: None (Room air) (03/12 1446)   Vitals Value Taken Time   BP 127/56 07/26/22 1446   Temp     Pulse 66 07/26/22 1446   Respirations 20 PER MINUTE 07/26/22 1446   SpO2 97 % 07/26/22 1446   O2 Device None (Room air) 07/26/22 1446   ABP     ART BP           Post Anesthesia Evaluation Note    Evaluation location: Pre/Post  Patient participation: recovered; patient participated in evaluation  Level of consciousness: alert  Pain management: adequate    Hydration: normovolemia  Temperature: 36.0C - 38.4C  Airway patency: adequate    Perioperative Events       Post-op nausea and vomiting: no PONV    Postoperative Status  Cardiovascular status: hemodynamically stable  Respiratory status: spontaneous ventilation  Follow-up needed: none        Perioperative Events  There were no known complications for this encounter.

## 2022-07-27 ENCOUNTER — Encounter: Admit: 2022-07-27 | Discharge: 2022-07-27 | Payer: MEDICARE

## 2022-07-27 MED ADMIN — BUMETANIDE 2 MG PO TAB [9311]: 1 mg | ORAL | @ 15:00:00 | Stop: 2022-07-27 | NDC 50268013211

## 2022-07-27 MED ADMIN — DOFETILIDE 500 MCG PO CAP [82575]: 500 ug | ORAL | NDC 69452013317

## 2022-07-27 MED ADMIN — DOFETILIDE 500 MCG PO CAP [82575]: 500 ug | ORAL | @ 21:00:00 | Stop: 2022-07-27 | NDC 69452013317

## 2022-07-27 MED ADMIN — APIXABAN 5 MG PO TAB [315778]: 5 mg | ORAL | @ 14:00:00 | NDC 00003089431

## 2022-07-27 MED ADMIN — POTASSIUM CHLORIDE 10 MEQ PO TBTQ [35942]: 10 meq | ORAL | @ 23:00:00 | NDC 00245531789

## 2022-07-27 MED ADMIN — ACETAMINOPHEN 325 MG PO TAB [101]: 650 mg | ORAL | @ 10:00:00 | NDC 00904677361

## 2022-07-27 MED ADMIN — LOSARTAN 50 MG PO TAB [76938]: 50 mg | ORAL | @ 14:00:00 | NDC 65862020290

## 2022-07-27 MED ADMIN — CARVEDILOL 6.25 MG PO TAB [77309]: 6.25 mg | ORAL | @ 02:00:00 | NDC 68382009301

## 2022-07-27 MED ADMIN — APIXABAN 5 MG PO TAB [315778]: 5 mg | ORAL | @ 02:00:00 | NDC 00003089431

## 2022-07-27 MED ADMIN — DOFETILIDE 500 MCG PO CAP [82575]: 500 ug | ORAL | @ 10:00:00 | Stop: 2022-07-27 | NDC 69452013317

## 2022-07-27 MED ADMIN — BUMETANIDE 2 MG PO TAB [9311]: 1 mg | ORAL | @ 23:00:00 | NDC 50268013211

## 2022-07-27 MED ADMIN — POTASSIUM CHLORIDE 10 MEQ PO TBTQ [35942]: 10 meq | ORAL | @ 15:00:00 | NDC 00245531789

## 2022-07-27 MED ADMIN — CARVEDILOL 6.25 MG PO TAB [77309]: 6.25 mg | ORAL | @ 14:00:00 | NDC 68382009301

## 2022-07-27 NOTE — Progress Notes
Report Sheet    DCCV           Indication: Atrial fib  Ordering Provider: Geroge Baseman     Name: Susan Bailey     Age: 82 y.o.    DOB: 04/11/1941     MRN: [id number]    RM #:  [facility]    RN:     Phone #:     Isolation: None     Communication Barrier: None     NPO For Thursday    A&O Yes   Travel Wch    IV SL    O2 RA   Additional Information: Tikosyn     Already had 3 doses   on 07/26/22    CV on 07/26/22    Not work                CV for 07/28/22    Lab(s) needed:   Other:   Device check needed:     Device: No results found for: GENERATOR, EPDEVTYP    Prior TEE:      Prior DCCV:      Prior Echo: 06/23/22  EF 55-60%  Previous TEE/DCCV details:     EF:   ECHO EF   Date Value Ref Range Status   06/23/2022 55 % Final       Anticoag:  Eliquis    Dose:    5    Frequency:     Last Dose:     Missed:    LAB:   Lab Results   Component Value Date/Time    HGB 11.9 (L) 07/27/2022 04:36 AM    PLTCT 300 07/27/2022 04:36 AM    GLU 84 07/27/2022 04:36 AM    NA 140 07/27/2022 04:36 AM    K 4.0 07/27/2022 04:36 AM    CR 0.70 07/27/2022 04:36 AM       Probe   Gastric Surgery    GERD    Swallow difficulty    Head/Neck Surg/Rad    Chest Surg/Rad    EGD    Varices/Esophag CA    GI bleed    Dental issues      Sedation   COPD    OSA/CPAP    Asthma    Pulm HTN    Anesthesia Issues    Smoker    ETOH & Frequency    Drug Use    Chronic Pain Med      Current Medications:   No current outpatient medications on file.       Past Medical/Surgical History:   Patient Active Problem List    Diagnosis Date Noted    Visit for monitoring Tikosyn therapy 07/25/2022    Hyperlipidemia, unspecified 06/14/2022    Hypertension 06/14/2022    Lymphedema 06/14/2022    Other chronic pain 06/14/2022    Vitamin D deficiency, unspecified 06/14/2022    Atrial fibrillation (HCC) 06/09/2022    Other screening mammogram 05/25/2007      Past Medical History:   Diagnosis Date    Arthritis 2018    Atrial fibrillation (HCC) 06-09-2022    HTN (hypertension) 08-28-2021    HX: anticoagulation 06-09-2022    Infection 06-18-2022    Diverticulitis  & cystitis    Postmenopausal 1996      Surgical History:   Procedure Laterality Date    ECHOCARDIOGRAM PROCEDURE  06-23-2022    ELECTROCARDIOGRAM  06-09-2022       Allergies:   Allergies   Allergen Reactions  Penicillins RASH and SEE COMMENTS     Happened with Amoxicillin     Jillyn Ledger, RN

## 2022-07-28 ENCOUNTER — Encounter: Admit: 2022-07-28 | Discharge: 2022-07-28 | Payer: MEDICARE

## 2022-07-28 MED ADMIN — ACETAMINOPHEN 325 MG PO TAB [101]: 650 mg | ORAL | @ 13:00:00 | Stop: 2022-07-28 | NDC 00904677361

## 2022-07-28 MED ADMIN — APIXABAN 5 MG PO TAB [315778]: 5 mg | ORAL | @ 02:00:00 | NDC 00003089431

## 2022-07-28 MED ADMIN — APIXABAN 5 MG PO TAB [315778]: 5 mg | ORAL | @ 14:00:00 | Stop: 2022-07-28 | NDC 00003089431

## 2022-07-28 MED ADMIN — POTASSIUM CHLORIDE 10 MEQ PO TBTQ [35942]: 10 meq | ORAL | @ 13:00:00 | Stop: 2022-07-28 | NDC 00245531789

## 2022-07-28 MED ADMIN — CARVEDILOL 6.25 MG PO TAB [77309]: 6.25 mg | ORAL | @ 02:00:00 | NDC 68382009301

## 2022-07-28 MED ADMIN — LOSARTAN 50 MG PO TAB [76938]: 50 mg | ORAL | @ 14:00:00 | Stop: 2022-07-28 | NDC 65862020290

## 2022-07-28 MED ADMIN — BUMETANIDE 2 MG PO TAB [9311]: 1 mg | ORAL | @ 14:00:00 | Stop: 2022-07-28 | NDC 50268013211

## 2022-07-28 MED ADMIN — CARVEDILOL 6.25 MG PO TAB [77309]: 6.25 mg | ORAL | @ 14:00:00 | Stop: 2022-07-28 | NDC 68382009301

## 2022-07-28 MED ADMIN — MAGNESIUM SULFATE IN D5W 1 GRAM/100 ML IV PGBK [166578]: 1 g | INTRAVENOUS | @ 13:00:00 | Stop: 2022-07-28 | NDC 44567041024

## 2022-07-28 MED ADMIN — DOFETILIDE 500 MCG PO CAP [82575]: 500 ug | ORAL | @ 09:00:00 | Stop: 2022-07-28 | NDC 69452013317

## 2022-07-28 MED ADMIN — SODIUM CHLORIDE 0.9 % IV SOLP [27838]: 250 mL | INTRAVENOUS | @ 13:00:00 | Stop: 2022-07-28 | NDC 00338004902

## 2022-07-28 MED FILL — DOFETILIDE 500 MCG PO CAP: 500 mcg | ORAL | 30 days supply | Qty: 60 | Fill #1 | Status: CP

## 2022-07-29 ENCOUNTER — Encounter: Admit: 2022-07-29 | Discharge: 2022-07-29 | Payer: MEDICARE

## 2022-07-29 MED ORDER — POTASSIUM CHLORIDE 10 MEQ PO TBER
10 meq | ORAL_TABLET | Freq: Two times a day (BID) | ORAL | 3 refills | 30.00000 days | Status: AC
Start: 2022-07-29 — End: ?

## 2022-08-01 ENCOUNTER — Encounter: Admit: 2022-08-01 | Discharge: 2022-08-01 | Payer: MEDICARE

## 2022-08-01 NOTE — Telephone Encounter
January called into the nursling line stating that her dentist needs to put 2 crowns on, but they are requiring cardiac clearance for the procedure.  She states that the do not need to hold Eliquis for this.  She is scheduled April 16 if approved by cardiology.  Her dentist is Dr. Ebony Hail [place] at [facility], Fax [phone].    Will route to [facility] for review and recommendations.

## 2022-08-01 NOTE — Telephone Encounter
Lurline Del, MD  Baldwin Crown, RN  Caller: Unspecified (Today,  2:12 PM)  She would be low risk for the dental procedure from a cardiovascular perspective. If needed, we can fax a letter.    Thanks  Swathi    Faxed note to Dr. Amaryllis Dyke office.

## 2022-08-02 ENCOUNTER — Encounter: Admit: 2022-08-02 | Discharge: 2022-08-02 | Payer: MEDICARE

## 2022-08-02 MED ORDER — BUMETANIDE 1 MG PO TAB
1 mg | ORAL_TABLET | Freq: Two times a day (BID) | ORAL | 3 refills | Status: AC
Start: 2022-08-02 — End: ?

## 2022-08-08 ENCOUNTER — Encounter: Admit: 2022-08-08 | Discharge: 2022-08-08 | Payer: MEDICARE

## 2022-08-08 MED ORDER — POTASSIUM CHLORIDE 10 MEQ PO TBER
10 meq | Freq: Every day | ORAL | 0 refills | 30.00000 days | Status: AC
Start: 2022-08-08 — End: ?

## 2022-08-08 MED ORDER — BUMETANIDE 1 MG PO TAB
1 mg | Freq: Every day | ORAL | 0 refills | Status: AC
Start: 2022-08-08 — End: ?

## 2022-08-08 NOTE — Telephone Encounter
-----   Message from Lurline Del, MD sent at 08/08/2022 12:49 PM CDT -----  Regarding: RE: med questions  Yes, she can take the Kenalog on tikosyn, would check with the provider doing her injection about her blood thinner. She can take bumex 1 a day and monitor her weight.     Thanks  Swathi  ----- Message -----  From: Asencion Noble  Sent: 08/08/2022  11:29 AM CDT  To: Lurline Del, MD  Subject: med questions                                    Wants to know if ok to have kenalog knee inj on tikosyn.  Wants to know if ok to back down on diuretic.  Wt down 15lbs and since out of a-fib not retaining fluid. She reduced to bumex and kcl to 1 dailydaily the last couple of days and has maintained weight.    Thanks  Richardson Landry

## 2022-08-12 ENCOUNTER — Encounter: Admit: 2022-08-12 | Discharge: 2022-08-12 | Payer: MEDICARE

## 2022-08-12 MED ORDER — DOFETILIDE 500 MCG PO CAP
500 ug | ORAL_CAPSULE | Freq: Two times a day (BID) | ORAL | 3 refills | Status: AC
Start: 2022-08-12 — End: ?

## 2022-08-18 ENCOUNTER — Encounter: Admit: 2022-08-18 | Discharge: 2022-08-18 | Payer: MEDICARE

## 2022-08-19 ENCOUNTER — Encounter: Admit: 2022-08-19 | Discharge: 2022-08-19 | Payer: MEDICARE

## 2022-08-19 DIAGNOSIS — G4733 Obstructive sleep apnea (adult) (pediatric): Secondary | ICD-10-CM

## 2022-08-19 NOTE — Telephone Encounter
08/19/2022 3:01 PM   Pt returned RN's call and asked what the sleep clinic appt would consist of. RN informed pt that likely the pulmonologist will go over the home sleep study results and will go over treatment options. Pt verbalized understanding.

## 2022-08-24 ENCOUNTER — Encounter: Admit: 2022-08-24 | Discharge: 2022-08-24 | Payer: MEDICARE

## 2022-10-13 ENCOUNTER — Encounter: Admit: 2022-10-13 | Discharge: 2022-10-13 | Payer: MEDICARE

## 2022-10-13 DIAGNOSIS — M1990 Unspecified osteoarthritis, unspecified site: Secondary | ICD-10-CM

## 2022-10-13 DIAGNOSIS — I4891 Unspecified atrial fibrillation: Secondary | ICD-10-CM

## 2022-10-13 DIAGNOSIS — G4730 Sleep apnea, unspecified: Secondary | ICD-10-CM

## 2022-10-13 DIAGNOSIS — Z780 Asymptomatic menopausal state: Secondary | ICD-10-CM

## 2022-10-13 DIAGNOSIS — B999 Unspecified infectious disease: Secondary | ICD-10-CM

## 2022-10-13 DIAGNOSIS — I10 Essential (primary) hypertension: Secondary | ICD-10-CM

## 2022-10-13 DIAGNOSIS — Z9229 Personal history of other drug therapy: Secondary | ICD-10-CM

## 2022-10-13 DIAGNOSIS — I890 Lymphedema, not elsewhere classified: Secondary | ICD-10-CM

## 2022-10-13 DIAGNOSIS — R0989 Other specified symptoms and signs involving the circulatory and respiratory systems: Secondary | ICD-10-CM

## 2022-10-13 MED ORDER — POTASSIUM CHLORIDE 10 MEQ PO TBER
10 meq | ORAL_TABLET | Freq: Every day | ORAL | 3 refills | 30.00000 days | Status: AC
Start: 2022-10-13 — End: ?

## 2022-10-13 MED ORDER — LOSARTAN 50 MG PO TAB
50 mg | ORAL_TABLET | Freq: Every day | ORAL | 3 refills | 90.00000 days | Status: AC
Start: 2022-10-13 — End: ?

## 2022-10-13 MED ORDER — BUMETANIDE 1 MG PO TAB
1-3 mg | ORAL_TABLET | ORAL | 3 refills | Status: AC
Start: 2022-10-13 — End: ?

## 2022-10-13 MED ORDER — ELIQUIS 5 MG PO TAB
5 mg | ORAL_TABLET | Freq: Two times a day (BID) | ORAL | 3 refills | Status: AC
Start: 2022-10-13 — End: ?

## 2022-10-13 MED ORDER — DOFETILIDE 500 MCG PO CAP
500 ug | ORAL_CAPSULE | Freq: Two times a day (BID) | ORAL | 3 refills | Status: AC
Start: 2022-10-13 — End: ?

## 2022-10-13 NOTE — Patient Instructions
Referral to vascular Dr. Trixie Dredge  Follow up in 6 months  Follow up as directed.  Call sooner if issues.  Call the [facility] nursing line at [phone].  Leave a detailed message for the nurse in [facility] with how we can assist you and we will call you back.

## 2022-10-13 NOTE — Progress Notes
Cardiovascular Medicine       Date of Service: 10/13/2022      HPI     Susan Bailey is a 82 y.o. female who was seen today in the Cardiovascular Medicine Clinic at [facility] at our [place] office.     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     She presented to me last year with fatigue and worsening lower extremity edema.  She was found to be in atrial fibrillation.  Given decreased functional status, I discussed cardioversion and antiarrhythmic initiation with her.  She agreed.  She was admitted for Tikosyn initiation in March 2024, she was cardioverted after 2 doses which was initially successful, then she reverted back to atrial fibrillation.  EP planned to complete the 5 courses and then reattempt cardioversion, however after 3 doses she converted on her own and remained in sinus overnight and into the next day.  EKG was stable and she was discharged without beta-blocker due to slow sinus rates.    She is doing well today.  She states that her exercise tolerance is significantly improved since her cardioversion.  She occasionally feels some skipped beats but none that are disabling or caused her to stop doing what she is doing.  She does have continued right lower extremity edema that is significantly worse than left despite use of diuretics.  She takes Bumex with a potassium supplement 3-4 times a week.  I discussed referral to vascular medicine with her and she agreed.  Her blood pressure is mildly elevated in clinic today but she states it is stable at home.         She does not have any known history of cardiac disease.  She had a coronary artery calcium score in October 2023 that was normal.           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     Transthoracic echocardiogram 06/2022:  Normal left ventricular cavitary dimensions with concentric remodeling  Normal left ventricular systolic function with a visually estimated ejection fraction of ~55-60 %.  Normal right ventricular cavitary size with normal RV systolic function.  Unable to determine LV diastolic dysfunction secondary to underlying atrial fibrillation.  No left ventricular focal regional wall motion abnormality.  No chamber enlargement.  No hemodynamically significant valvular stenosis  Mild mitral, aortic and moderate tricuspid valve insufficiency.  Estimated PA systolic pressure is 28 mmHg  No pericardial effusion.               Assessment & Plan   82 y.o. female patient with the following medical problems:    Paroxysmal atrial fibrillation s/p cardioversion on Tikosyn and apixaban.  Hypertension.  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  Lymphedema.    Continue dofetilide and apixaban for management of atrial fibrillation.  Blood pressure at home is well-controlled, continue losartan.  Will make referral to vascular clinic for management of asymmetric right lower extremity edema.      Return to clinic in 6 months.         Past Medical History  Patient Active Problem List    Diagnosis Date Noted    Visit for monitoring Tikosyn therapy 07/25/2022    Hyperlipidemia, unspecified 06/14/2022    Hypertension 06/14/2022  Lymphedema 06/14/2022    Other chronic pain 06/14/2022    Vitamin D deficiency, unspecified 06/14/2022    Atrial fibrillation (HCC) 06/09/2022    Other screening mammogram 05/25/2007       I reviewed and confirmed this patient's problem list, active medications, allergies, and past medical, social, family & tobacco histories.     Review of Systems  14 point review of systems negative except as above.    Vitals:    10/13/22 1215   BP: (!) 157/75   BP Source: Arm, Left Upper   Pulse: 55   SpO2: 98%   O2 Device: None (Room air)   PainSc: Zero   Weight: 74.3 kg (163 lb 12.8 oz)   Height: 152.4 cm (5')     Body mass index is 31.99 kg/m?Marland Kitchen     Physical Exam  General Appearance: no acute distress  HEENT: EOMI, mucous membranes moist, oropharynx is clear  Neck Veins: neck veins are flat & not distended  Carotid Arteries: no bruits  Chest Inspection: chest is normal in appearance  Auscultation/Percussion: lungs clear to auscultation, no rales, rhonchi, or wheezing  Cardiac Rhythm: regular rhythm & normal rate  Cardiac Auscultation: Normal S1 & S2, no S3 or S4, no rub  Murmurs: no cardiac murmurs  Abdominal Exam: soft, non-tender, normal bowel sounds, no masses or bruits  Abdominal aorta: nonpalpable   Liver & Spleen: no organomegaly  Extremities: 3+ pitting edema of right lower extremity, 1+ pitting edema of the left lower extremity, palpable distal pulses  Skin: warm & intact  Neurologic Exam: oriented to time, place and person; no focal neurologic deficits       Cardiovascular Studies  06/23/22   2D + DOPPLER ECHO   Result Value Ref Range    BSA 1.86 m2    Referring Provider Charitie Hinote     LVIDD 4.4 3.8 - 5.2 cm    LVIDS 2.5 2.2 - 3.5 cm    IVS 1.0 0.6 - 0.9 cm    PW 1.0 0.6 - 0.9 cm    FS 43.18 28 - 44 %    Teichholtz 73.41 %    LA size 3.9 2.7 - 3.8 cm    Left Ventricle Systolic Volume 30 14 - 42 mL    Left Ventricle Systolic Volume Index 16 8 - 24 mL/m2    Left Ventricle Diastolic Volume 65 46 - 106 mL    Left Ventricle Diastolic Volume Index 35 29 - 61 mL/m2    LA volume 61 22 - 52 mL    Left Atrium Index 32.80 16 - 34 mL/m2    LV mass 148 67 - 162 g    Left Ventricle Mass Index 79 43 - 95 g/m2    RWT 0.45 <=0.42    Right Ventricular Basal Diameter 4.2 2.5 - 4.1 cm    Right Atrial Area 18.0 <18 cm2    Right Ventricular Mid Diameter 2.6 1.9 - 3.5 cm    Right Heart Systolic Mmode TAPSE 1.6 >1.7 cm    Sinus 2.7 2.4 - 3.6 cm    Proximal aorta 2.9 1.9 - 3.5 cm    LVOT diameter 1.9 cm    LVOT area 2.84 cm2 LVOT peak VTI 18.0 cm    LVOT stroke volume 51.04 cm3    MV Comp diameter 1.9 cm    MV Comp area 2.84 cm2    MV Incomp diameter 3.2 cm    MV Incomp area 8.04 cm2  TR PEAK VELOCITY 2.5 m/s    RV SYSTOLIC PRESSURE 25     RA PRESSURE 3     TV rest pulmonary artery pressure 28 mmHg    TDI Medial e' 0.080 m/s    Medial E/E' ratio 12.50     TDI lateral e' 0.120 m/s    Lateral E/E' ratio 8.33     MV Peak E Vel PW 1.000 m/s    Right Heart Systolic TDI S' 0.1 m/s    SIMPSON'S BIPLANE EF 54 %    ECHO EF 55 %        Cardiovascular Health Factors  Vitals BP Readings from Last 3 Encounters:   07/28/22 130/57   07/07/22 124/76   06/23/22 125/66     Wt Readings from Last 3 Encounters:   07/25/22 78.3 kg (172 lb 9.6 oz)   07/07/22 81.1 kg (178 lb 12.8 oz)   06/23/22 82.1 kg (181 lb)     BMI Readings from Last 3 Encounters:   07/25/22 27.86 kg/m?   07/07/22 34.92 kg/m?   06/23/22 35.35 kg/m?      Smoking Social History     Tobacco Use   Smoking Status Never   Smokeless Tobacco Never      Lipid Profile Cholesterol   Date Value Ref Range Status   01/18/2022 195  Final     HDL   Date Value Ref Range Status   01/18/2022 66  Final     LDL   Date Value Ref Range Status   01/18/2022 116 (H) <100 Final     Triglycerides   Date Value Ref Range Status   01/18/2022 66  Final      Blood Sugar No results found for: HGBA1C  Glucose   Date Value Ref Range Status   07/28/2022 89 70 - 100 MG/DL Final   65/78/4696 84 70 - 100 MG/DL Final   29/52/8413 93 70 - 100 MG/DL Final        ASCVD Risk Assessment:     ASCVD 10-year risk calculated: The ASCVD Risk score (Arnett DK, et al., 2019) failed to calculate for the following reasons:    The 2019 ASCVD risk score is only valid for ages 40 to 23     LDL 70-189, if ASCVD 10-y risk is >7.5%, high to moderate-intensity statin therapy is recommended  Diabetes with ASCVD 10-y risk >7.5%, high-intensity statin therapy is recommended.  Diabetes with ASCVD 10-y risk <7.5%, moderate-intensity statin therapy is recommended.      Current Medications (including today's revisions)   alendronate (FOSAMAX) 70 mg tablet Take one tablet by mouth every 7 days.    bumetanide (BUMEX) 1 mg tablet Take one tablet by mouth daily. If weight continues to increase may take an additional 1 mg in AM so total of 2 mg in AM and 1 mg in afternoon for total daily dose of 3 mg    CALCIUM CITRATE PO Take 600 mg by mouth daily.    carvediloL (COREG) 6.25 mg tablet Take one tablet by mouth twice daily.    CHOLEcalciferoL (vitamin D3) (OPTIMAL D3) 50,000 units capsule Take one capsule by mouth every 7 days.    dofetilide (TIKOSYN) 500 mcg capsule Take one capsule by mouth every 12 hours. Indications: prevention of recurrent atrial fibrillation    ELIQUIS 5 mg tablet Take one tablet by mouth twice daily.    estradioL (CLIMARA) 0.025 mg/24 hr patch Apply one patch to top of skin as directed every  7 days.    Geriatric Multivit w/Iron-Min tab Take 1 tablet by mouth daily.    losartan (COZAAR) 50 mg tablet Take one tablet by mouth daily.    potassium chloride (KLOR-CON 10) 10 mEq tablet Take one tablet by mouth daily.    sodium chloride (SEA MIST) 0.65 % nasal spray Apply two sprays into nose as directed every 4 hours as needed.         Orpah Cobb MD  Cardiovascular Medicine.

## 2022-12-12 ENCOUNTER — Encounter: Admit: 2022-12-12 | Discharge: 2022-12-12 | Payer: MEDICARE

## 2022-12-12 ENCOUNTER — Ambulatory Visit: Admit: 2022-12-12 | Discharge: 2022-12-13 | Payer: MEDICARE

## 2022-12-12 DIAGNOSIS — Z9229 Personal history of other drug therapy: Secondary | ICD-10-CM

## 2022-12-12 DIAGNOSIS — M1990 Unspecified osteoarthritis, unspecified site: Secondary | ICD-10-CM

## 2022-12-12 DIAGNOSIS — I10 Essential (primary) hypertension: Secondary | ICD-10-CM

## 2022-12-12 DIAGNOSIS — I890 Lymphedema, not elsewhere classified: Secondary | ICD-10-CM

## 2022-12-12 DIAGNOSIS — M7989 Other specified soft tissue disorders: Secondary | ICD-10-CM

## 2022-12-12 DIAGNOSIS — Z780 Asymptomatic menopausal state: Secondary | ICD-10-CM

## 2022-12-12 DIAGNOSIS — B999 Unspecified infectious disease: Secondary | ICD-10-CM

## 2022-12-12 DIAGNOSIS — L0390 Cellulitis, unspecified: Secondary | ICD-10-CM

## 2022-12-12 DIAGNOSIS — I4891 Unspecified atrial fibrillation: Secondary | ICD-10-CM

## 2022-12-12 DIAGNOSIS — G4730 Sleep apnea, unspecified: Secondary | ICD-10-CM

## 2022-12-12 DIAGNOSIS — I872 Venous insufficiency (chronic) (peripheral): Secondary | ICD-10-CM

## 2022-12-12 NOTE — Patient Instructions
Follow-Up:    -Thank you for allowing [place] to participate in your care today. Your After Visit Summary is being completed by Lendon Ka, RN.    -We would like you to follow up in  6 months with Dr. Woody Seller  -The schedule is released approximately 4-5 months in advance. You will be called by our scheduling department to make an appointment and you will also receive a notification via MyChart to self-schedule.  However, if you would like to call to make this appointment, please call [phone].    -Please schedule the following testing at check out, or by calling our scheduling line: VI Study Limited    Changes From Today's Office Visit     Our scheduler will call you to schedule your VI study    Follow up in 6 months    We have placed referrals for lymphedema therapy at [facility] and lymphedema pumps. It can take 3-4 weeks for insurance approval and coordination for this appointment/device. Please let us know if you have not heard from [facility] or [facility] after 4 weeks.     Contacting our office:    -Business Hours: Monday-Friday, 8:00 am-4:30 pm (excluding Holidays).     -For medical questions or concerns, please send [place] a message through your MyChart account or call the [facility] team nursing triage line at [phone]. Please leave a detailed message with your name, date of birth, and reason for your call.  If your message is received before 3:30pm, every effort will be made to call you back the same day.  Please allow time for [place] to review your chart prior to call back.     -For medication refills please start by contacting your pharmacy. You can also send [place] a prescription question through your MyChart or call the nurse triage line above.     -Should you have an immediate need of the weekend/nights and holidays, please call our on-call triage line at [phone].    [facility] nursing team fax number: [phone]    -You may receive a survey in the upcoming weeks from [facility]. Your feedback is important to [place] and helps [place] continue to improve patient care and patient satisfaction.     -Please feel free to call our Financial Department at [phone] with any questions or concerns about estimated cost of testing or imaging ordered today. We are happy to provide CPT codes upon request.    Results & Testing Follow Up:    -Please allow 5-7 business days for the results of any testing to be reviewed. Please call our office if you have not heard from a nurse within this time frame.    -Should you choose to complete testing at an outside facility, please contact our office after completion of testing so that we can ensure that we have received results for your provider to review.    Lab and test results:  As a part of the CARES act, starting 08/15/2019, some results will be released to you via MyChart immediately and automatically.  You may see results before your provider sees them; however, your provider will review all these results and then they, or one of their team, will notify you of result information and recommendations.   Critical results will be addressed immediately, but otherwise, please allow [place] time to get back with you prior to you reaching out to [place] for questions.  This will usually take about 72 hours for labs and 5-7 days  for procedure test results.

## 2022-12-13 DIAGNOSIS — R0989 Other specified symptoms and signs involving the circulatory and respiratory systems: Secondary | ICD-10-CM

## 2022-12-13 DIAGNOSIS — I10 Essential (primary) hypertension: Secondary | ICD-10-CM

## 2022-12-15 ENCOUNTER — Encounter: Admit: 2022-12-15 | Discharge: 2022-12-15 | Payer: MEDICARE

## 2023-01-10 ENCOUNTER — Encounter: Admit: 2023-01-10 | Discharge: 2023-01-10 | Payer: MEDICARE

## 2023-01-10 ENCOUNTER — Ambulatory Visit: Admit: 2023-01-10 | Discharge: 2023-01-11 | Payer: MEDICARE

## 2023-01-10 DIAGNOSIS — Z9229 Personal history of other drug therapy: Secondary | ICD-10-CM

## 2023-01-10 DIAGNOSIS — I10 Essential (primary) hypertension: Secondary | ICD-10-CM

## 2023-01-10 DIAGNOSIS — I4891 Unspecified atrial fibrillation: Secondary | ICD-10-CM

## 2023-01-10 DIAGNOSIS — Z780 Asymptomatic menopausal state: Secondary | ICD-10-CM

## 2023-01-10 DIAGNOSIS — I890 Lymphedema, not elsewhere classified: Secondary | ICD-10-CM

## 2023-01-10 DIAGNOSIS — G4733 Obstructive sleep apnea (adult) (pediatric): Secondary | ICD-10-CM

## 2023-01-10 DIAGNOSIS — M1990 Unspecified osteoarthritis, unspecified site: Secondary | ICD-10-CM

## 2023-01-10 DIAGNOSIS — G4730 Sleep apnea, unspecified: Secondary | ICD-10-CM

## 2023-01-10 DIAGNOSIS — L0390 Cellulitis, unspecified: Secondary | ICD-10-CM

## 2023-01-10 DIAGNOSIS — B999 Unspecified infectious disease: Secondary | ICD-10-CM

## 2023-01-10 NOTE — Telephone Encounter
Order for noc ox on CPAP and RA and AutoPAP 5-15cm H2O placed per recommendations.   DME: [facility]   Will continue to follow up.

## 2023-01-10 NOTE — Assessment & Plan Note
Discussed the pathophysiology of obstructive sleep apnea and risk factors for this disease. Potential consequences of no treatment include excessive daytime sleepiness/fatigue, cognitive dysfunction, adverse effects on mood, cardiovascular complications such as elevated blood pressure, heart attacks, arrhythmias, stroke, death, and association with metabolic syndrome. Reviewed treatment options in detail. Discussed with patient that CPAP is is gold standard therapy. Alternative options including mandibular advancement device, and surgical options were reviewed. In addition, adjunctive therapies such as weight loss were discussed. Recommend CPAP as first line therapy.       She was advised to use CPAP during any sleep periods.   Provided education/educational materials on CPAP hygiene and need for follow-up visits.

## 2023-01-10 NOTE — Progress Notes
Date of Service: 01/10/2023    Subjective:             Susan Bailey is a 82 y.o. female.    Home sleep apnea test 08/01/2022  pAHI 3% 22.7, pAHI 4% 15.9; time with saturation <88% 36 min. Lowest saturation 77.3%.   Cardiologist wanted her to order sleep study.   Paroxysmal atrial fibrillation: Continue dofetilide and apixaban for management of atrial fibrillation.     Transthoracic echocardiogram 06/2022:  Normal left ventricular cavitary dimensions with concentric remodeling  Normal left ventricular systolic function with a visually estimated ejection fraction of ~55-60 %.  Normal right ventricular cavitary size with normal RV systolic function.  Unable to determine LV diastolic dysfunction secondary to underlying atrial fibrillation.  No left ventricular focal regional wall motion abnormality.  No chamber enlargement.  No hemodynamically significant valvular stenosis  Mild mitral, aortic and moderate tricuspid valve insufficiency.  Estimated PA systolic pressure is 28 mmHg  No pericardial effusion.    History of Present Illness  Patient's sleep habits are described below:    Regular bed time: 12:00-1 am  Regular sleep onset latency (how long to fall asleep): 15-20 minutes   Has a crazy night every once a while where she tosses and turns and cannot sleep. Maybe once a month.   Number of awakenings per night: 1-2 times; restroom  Sleep latency after awakenings: 5-10 minutes  Usual wake time: 7:30-8 am  How does pt feel when waking up at that time? Refreshed    How many naps in a week? 2-3 times/week  How long are the naps on average? 1 hour  How do you feel after a nap? Refreshed    Any reports of snoring during sleep? Yes  Any reports of witnessed apneas during sleep? No  Dry mouth +  Bruxism -        01/03/2023     2:31 PM   Epworth Sleepiness Scale   Sitting and reading 1   Watching TV 1   Sitting inactive in a public place (e.g. a theater or a meeting) 0   As a passenger in a car for an hour without a break 1 Lying down to rest in the afternoon when circumstances permit 3   Sitting and talking to someone 0   Sitting quietly after a lunch without alcohol 0   In a car, while stopped for a few minutes in traffic 0   Epworth Sleepiness Scale Score 6       No drowsy driving    Any memory or concentration issues in the last few years? No  How many morning headaches have you had in the last month? No  What weight changes have you had in the last 2 years? 10 pounds since March  How often do you have restless leg symptoms per month? No    Any reports of talking/punching/kicking in your sleep? No  Any reports of sleepwalking? No  Any episodes of sleep paralysis? No    What caffeine do you drink in a day? Coffee; 2 cups in AM, 1/2 cup in PM  When is the last drink of caffeine in your day? 8-9 pm  How many alcoholic drinks in a week? Socially; twice week, 1-2 drinks  How many cigarettes/nicotine in a week? No  Do you use any other drugs? No    Does anyone in your family have any sleep issues? No biological; husband has OSA.  Objective:         alendronate (FOSAMAX) 70 mg tablet Take one tablet by mouth every 7 days.    bumetanide (BUMEX) 1 mg tablet Take one tablet to three tablets by mouth as directed. If weight continues to increase may take an additional 1 mg in AM so total of 2 mg in AM and 1 mg in afternoon for total daily dose of 3 mg    CALCIUM CITRATE PO Take 600 mg by mouth twice daily.    carvediloL (COREG) 6.25 mg tablet Take one tablet by mouth twice daily.    CHOLEcalciferoL (vitamin D3) (OPTIMAL D3) 50,000 units capsule Take one capsule by mouth every 7 days.    dofetilide (TIKOSYN) 500 mcg capsule Take one capsule by mouth every 12 hours. Indications: prevention of recurrent atrial fibrillation    ELIQUIS 5 mg tablet Take one tablet by mouth twice daily.    estradioL (CLIMARA) 0.025 mg/24 hr patch Apply one patch to top of skin as directed every 7 days.    Geriatric Multivit w/Iron-Min tab Take 1 tablet by mouth daily.    losartan (COZAAR) 50 mg tablet Take one tablet by mouth daily.    potassium chloride (KLOR-CON 10) 10 mEq tablet Take one tablet by mouth daily. When taking the Bumex    sodium chloride (SEA MIST) 0.65 % nasal spray Apply two sprays into nose as directed every 4 hours as needed.     Vitals:    01/10/23 1246   BP: 138/62   BP Source: Arm, Left Upper   Pulse: 78   Temp: 36.7 ?C (98 ?F)   Resp: 16   SpO2: 98%   TempSrc: Oral   Weight: 72.6 kg (160 lb)   Height: 167.6 cm (5' 6)     Body mass index is 25.82 kg/m?Marland Kitchen     Physical Exam  Vitals and nursing note reviewed.   Constitutional:       Appearance: Normal appearance.   HENT:      Head: Normocephalic.      Mouth/Throat:      Mouth: Mucous membranes are moist.      Comments: Mallampati 3  Cardiovascular:      Rate and Rhythm: Normal rate and regular rhythm.      Heart sounds: No murmur heard.     No friction rub. No gallop.   Pulmonary:      Effort: Pulmonary effort is normal. No respiratory distress.      Breath sounds: Normal breath sounds. No wheezing or rhonchi.   Chest:      Chest wall: No tenderness.   Musculoskeletal:      Cervical back: Normal range of motion and neck supple.   Neurological:      Mental Status: She is alert and oriented to person, place, and time.      Comments: Moves all extremities freely, sensation grossly intact   Psychiatric:         Mood and Affect: Mood normal.          Assessment and Plan:    Problem   Nocturnal Hypoxemia   Osa (Obstructive Sleep Apnea)    Home sleep apnea test 08/01/2022  pAHI 3% 22.7, pAHI 4% 15.9; time with saturation <88% 36 min. Lowest saturation 77.3%.          OSA (obstructive sleep apnea)  Discussed the pathophysiology of obstructive sleep apnea and risk factors for this disease. Potential consequences of no treatment include excessive daytime sleepiness/fatigue,  cognitive dysfunction, adverse effects on mood, cardiovascular complications such as elevated blood pressure, heart attacks, arrhythmias, stroke, death, and association with metabolic syndrome. Reviewed treatment options in detail. Discussed with patient that CPAP is is gold standard therapy. Alternative options including mandibular advancement device, and surgical options were reviewed. In addition, adjunctive therapies such as weight loss were discussed. Recommend CPAP as first line therapy.       She was advised to use CPAP during any sleep periods.   Provided education/educational materials on CPAP hygiene and need for follow-up visits.      RTC 3 months for compliance follow-up.     Seen and discussed with Dr. Jodi Geralds, MBBS  Sleep Medicine Fellow, [facility]  NPI: [id number]

## 2023-01-11 DIAGNOSIS — G4734 Idiopathic sleep related nonobstructive alveolar hypoventilation: Secondary | ICD-10-CM

## 2023-01-17 ENCOUNTER — Encounter: Admit: 2023-01-17 | Discharge: 2023-01-17 | Payer: MEDICARE

## 2023-01-17 NOTE — Telephone Encounter
This message is regarding your cardiology appointment for your Venous Insufficiency Lower Extremity Bilateral Scan on 01/19/2023. Please follow the following instructions:     Please drink (3) 8 oz glasses of water prior to your scan. Avoid any caffeine the evening or morning of your appointment. We ask that you also remove compression stockings 48-72 hours prior to your scan.    Please try to arrive 15 mins early to get checked in.

## 2023-01-18 ENCOUNTER — Encounter: Admit: 2023-01-18 | Discharge: 2023-01-18 | Payer: MEDICARE

## 2023-01-18 NOTE — Telephone Encounter
Patient called and LVM regarding questions about her VI study.    Returned call to patient. Patient states she did not see the message until late last night regarding her compression stockings needing to be off for 48-72 hours prior to the scan. Advised patient that if her compression stockings have not been off for a full 48 hours the best thing to do will be to reschedule to ensure we get the most accurate results from the scan. Patient verbalized frustration with the way she was notified and did not understand why she was not called. I apologized for the notifications system.     Message sent to scheduling.

## 2023-01-19 ENCOUNTER — Encounter: Admit: 2023-01-19 | Discharge: 2023-01-19 | Payer: MEDICARE

## 2023-02-09 ENCOUNTER — Encounter: Admit: 2023-02-09 | Discharge: 2023-02-09 | Payer: MEDICARE

## 2023-02-09 NOTE — Telephone Encounter
Received a message on nursing line requesting a callback. Susan Bailey has concerns of drug to drug interactions with a couple of new medications with her current eliquis and tykosin. Attempted to call patient with no answer from patient and no voicemail. Will attempt again at a later time.

## 2023-02-10 ENCOUNTER — Encounter: Admit: 2023-02-10 | Discharge: 2023-02-10 | Payer: MEDICARE

## 2023-02-11 ENCOUNTER — Encounter: Admit: 2023-02-11 | Discharge: 2023-02-11 | Payer: MEDICARE

## 2023-02-13 ENCOUNTER — Encounter: Admit: 2023-02-13 | Discharge: 2023-02-13 | Payer: MEDICARE

## 2023-02-16 ENCOUNTER — Encounter: Admit: 2023-02-16 | Discharge: 2023-02-16 | Payer: MEDICARE

## 2023-02-24 ENCOUNTER — Encounter: Admit: 2023-02-24 | Discharge: 2023-02-24 | Payer: MEDICARE

## 2023-02-24 NOTE — Telephone Encounter
This message is regarding your cardiology appointment 02/27/2023. Please follow the following instructions:     Drink (3)-8 Oz of water 2 hours prior.  No Caffeine the evening before or the morning of.   Remove Compression stockings 48 hours prior to scan.   Just a heads up this is a two hour study.

## 2023-02-24 NOTE — Telephone Encounter
Disregard last message. Your appointment in on Tuesday 02/28/23 not 10/14.       Drink (3)-8 Oz of water 2 hours prior.  No Caffeine the evening before or the morning of.   Remove Compression stockings 48 hours prior to scan  Just a heads up this is a two hour scan

## 2023-02-28 ENCOUNTER — Encounter: Admit: 2023-02-28 | Discharge: 2023-02-28 | Payer: MEDICARE

## 2023-02-28 ENCOUNTER — Ambulatory Visit: Admit: 2023-02-28 | Discharge: 2023-02-28 | Payer: MEDICARE

## 2023-02-28 DIAGNOSIS — M7989 Other specified soft tissue disorders: Secondary | ICD-10-CM

## 2023-03-10 ENCOUNTER — Encounter: Admit: 2023-03-10 | Discharge: 2023-03-10 | Payer: MEDICARE

## 2023-04-11 ENCOUNTER — Encounter: Admit: 2023-04-11 | Discharge: 2023-04-11 | Payer: MEDICARE

## 2023-04-11 ENCOUNTER — Ambulatory Visit: Admit: 2023-04-11 | Discharge: 2023-04-11 | Payer: MEDICARE

## 2023-04-11 NOTE — Telephone Encounter
Order for supplies placed as discussed per office note on 04/11/23    DME: [facility]      DME notified of remote pressure change

## 2023-04-11 NOTE — Assessment & Plan Note
Patient is well controlled on these settings and experiences symptomatic benefit.   She was advised to use CPAP during any sleep periods.

## 2023-04-11 NOTE — Progress Notes
Pressure changed remotely per Dr Rebekah Chesterfield and Routed to Nathanial Millman, MA to fax DME order.

## 2023-04-11 NOTE — Progress Notes
Date of Service: 04/11/2023    Subjective:             Susan Bailey is a 82 y.o. female.    Home sleep apnea test 08/01/2022  pAHI 3% 22.7, pAHI 4% 15.9; time with saturation <88% 36 min. Lowest saturation 77.3%.   Cardiologist wanted her to order sleep study.   Paroxysmal atrial fibrillation: Continue dofetilide and apixaban for management of atrial fibrillation.     History of Present Illness  Last seen by me in clinic on 01/10/2023.   Moderate sleep apnea on HSAT 08/01/2022.   Here for compliance follow-up.     She denies any snoring since the CPAP was started.   Feels a little bit more rested since using the machine.   Epworth Sleepiness Scale Score: 7/24    She was worried about the emergence of centrals on the download.   Requesting a new mask, AirFit F30i.          Objective:         alendronate (FOSAMAX) 70 mg tablet Take one tablet by mouth every 7 days.    bumetanide (BUMEX) 1 mg tablet Take one tablet to three tablets by mouth as directed. If weight continues to increase may take an additional 1 mg in AM so total of 2 mg in AM and 1 mg in afternoon for total daily dose of 3 mg    CALCIUM CITRATE PO Take 600 mg by mouth twice daily.    carvediloL (COREG) 6.25 mg tablet Take one tablet by mouth twice daily.    CHOLEcalciferoL (vitamin D3) (OPTIMAL D3) 50,000 units capsule Take one capsule by mouth every 7 days.    dofetilide (TIKOSYN) 500 mcg capsule Take one capsule by mouth every 12 hours. Indications: prevention of recurrent atrial fibrillation    ELIQUIS 5 mg tablet Take one tablet by mouth twice daily.    estradioL (CLIMARA) 0.025 mg/24 hr patch Apply one patch to top of skin as directed every 7 days.    Geriatric Multivit w/Iron-Min tab Take 1 tablet by mouth daily.    losartan (COZAAR) 50 mg tablet Take one tablet by mouth daily.    potassium chloride (KLOR-CON 10) 10 mEq tablet Take one tablet by mouth daily. When taking the Bumex    sodium chloride (SEA MIST) 0.65 % nasal spray Apply two sprays into nose as directed every 4 hours as needed.     Vitals:    04/11/23 1031   BP: 134/59   BP Source: Arm, Left Upper   Pulse: 52   Temp: 36.4 ?C (97.5 ?F)   Resp: 16   SpO2: 100%   TempSrc: Oral   PainSc: Zero   Weight: 73.9 kg (163 lb)   Height: 167.6 cm (5' 6)     Body mass index is 26.31 kg/m?Marland Kitchen     Physical Exam  Vitals and nursing note reviewed.   Constitutional:       Appearance: Normal appearance.   HENT:      Mouth/Throat:      Mouth: Mucous membranes are moist.      Comments: Mallampati 3  Cardiovascular:      Rate and Rhythm: Normal rate and regular rhythm.      Heart sounds: No murmur heard.  Pulmonary:      Effort: Pulmonary effort is normal. No respiratory distress.      Breath sounds: Normal breath sounds. No stridor. No wheezing.   Chest:  Chest wall: No tenderness.   Musculoskeletal:      Cervical back: Normal range of motion and neck supple.   Neurological:      Mental Status: She is alert.   Psychiatric:         Mood and Affect: Mood normal.          Assessment and Plan:    Problem   Osa (Obstructive Sleep Apnea)    Home sleep apnea test 08/01/2022  pAHI 3% 22.7, pAHI 4% 15.9; time with saturation <88% 36 min. Lowest saturation 77.3%          OSA (obstructive sleep apnea)  Patient is well controlled on these settings and experiences symptomatic benefit.   She was advised to use CPAP during any sleep periods.     She wishes to try the F30i mask.   Requesting the pressures to be narrowed; she finds starting PAP pressure of 5 cm H2O too low.   Will narrow pressures to 8-15 cm H2O.   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   Discussed this is usually self limiting with starting PAP therapy.   Will keep an eye on it.     RTC 6 months.         Seen and discussed with Dr. Jodi Geralds, MBBS  Sleep Medicine Fellow, [facility]  NPI: [id number]

## 2023-04-12 DIAGNOSIS — G4733 Obstructive sleep apnea (adult) (pediatric): Secondary | ICD-10-CM

## 2023-04-13 ENCOUNTER — Encounter: Admit: 2023-04-13 | Discharge: 2023-04-13 | Payer: MEDICARE

## 2023-04-19 ENCOUNTER — Encounter: Admit: 2023-04-19 | Discharge: 2023-04-19 | Payer: MEDICARE

## 2023-04-20 ENCOUNTER — Ambulatory Visit: Admit: 2023-04-20 | Discharge: 2023-04-20 | Payer: MEDICARE

## 2023-04-20 ENCOUNTER — Encounter: Admit: 2023-04-20 | Discharge: 2023-04-20 | Payer: MEDICARE

## 2023-04-20 DIAGNOSIS — I4891 Unspecified atrial fibrillation: Secondary | ICD-10-CM

## 2023-04-20 DIAGNOSIS — I10 Essential (primary) hypertension: Secondary | ICD-10-CM

## 2023-04-20 DIAGNOSIS — I890 Lymphedema, not elsewhere classified: Secondary | ICD-10-CM

## 2023-04-20 DIAGNOSIS — R0989 Other specified symptoms and signs involving the circulatory and respiratory systems: Secondary | ICD-10-CM

## 2023-04-20 NOTE — Progress Notes
Cardiovascular Medicine       Date of Service: 04/20/2023      HPI     Susan Bailey is a 82 y.o. female who was seen today in the Cardiovascular Medicine Clinic at [facility] at our [place] office.     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     She presented to me last year with fatigue and worsening lower extremity edema.  She was found to be in atrial fibrillation.  Given decreased functional status, I discussed cardioversion and antiarrhythmic initiation with her.  She agreed.  She was admitted for Tikosyn initiation in March 2024, she was cardioverted after 2 doses which was initially successful, then she reverted back to atrial fibrillation.  EP planned to complete the 5 courses and then reattempt cardioversion, however after 3 doses she converted on her own and remained in sinus overnight and into the next day.       She is doing well today.  She states that her exercise tolerance is significantly improved since her cardioversion.  She was also diagnosed with sleep apnea and is currently on CPAP therapy, she is working with her sleep physician to find the appropriate mask for her.  She does have continued right lower extremity edema that is significantly worse than left despite use of diuretics.  She has lymphedema and is currently using lymphedema pumps and sees Dr. Trixie Dredge in clinic.  She does not have any known history of cardiac disease.  She had a coronary artery calcium score in October 2023 that was normal.           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     Transthoracic echocardiogram 06/2022:  Normal left ventricular cavitary dimensions with concentric remodeling  Normal left ventricular systolic function with a visually estimated ejection fraction of ~55-60 %.  Normal right ventricular cavitary size with normal RV systolic function.  Unable to determine LV diastolic dysfunction secondary to underlying atrial fibrillation.  No left ventricular focal regional wall motion abnormality.  No chamber enlargement.  No hemodynamically significant valvular stenosis  Mild mitral, aortic and moderate tricuspid valve insufficiency.  Estimated PA systolic pressure is 28 mmHg  No pericardial effusion.                          Assessment & Plan   82 y.o. female patient with the following medical problems:    Paroxysmal atrial fibrillation s/p cardioversion on Tikosyn and apixaban.  Hypertension.  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  Lymphedema.     Continue dofetilide and apixaban for management of atrial fibrillation.  Lower extremity edema secondary to lymphedema makes assessment of volume status challenging but overall she appears well compensated.  Blood pressure is elevated today in clinic but she reports that at home is well-controlled, continue losartan and carvedilol.  Continue use of lymphedema pumps, she follows with vascular clinic.        Return to clinic in 1 year.        Past Medical History  Patient Active Problem List    Diagnosis Date Noted    Nocturnal hypoxemia 01/10/2023    OSA (obstructive sleep apnea) 01/10/2023     Home sleep  apnea test 08/01/2022  pAHI 3% 22.7, pAHI 4% 15.9; time with saturation <88% 36 min. Lowest saturation 77.3%      Visit for monitoring Tikosyn therapy 07/25/2022    Hyperlipidemia, unspecified 06/14/2022    Hypertension 06/14/2022    Lymphedema 06/14/2022    Other chronic pain 06/14/2022    Vitamin D deficiency, unspecified 06/14/2022    Atrial fibrillation (HCC) 06/09/2022     06/23/22 - 2D + DOPPLER ECHO: Normal left ventricular cavitary dimensions with concentric remodeling. Normal left ventricular systolic function with a visually estimated ejection fraction of ~55-60 %.Normal right ventricular cavitary size with normal RV systolic function.Unable to determine LV diastolic dysfunction secondary to underlying atrial fibrillation.No left ventricular focal regional wall motion abnormality.No chamber enlargement. No hemodynamically significant valvular stenosis. Mild mitral, aortic and moderate tricuspid valve insufficiency. Estimated PA systolic pressure is 28 mmHgNo pericardial effusion.  07/26/22 - CARDIOVERSION, EXTERNAL: Successful DC cardioversion of atrial fibrillation to sinus rhythm.       Other screening mammogram 05/25/2007       I reviewed and confirmed this patient's problem list, active medications, allergies, and past medical, social, family & tobacco histories.     Review of Systems  Review of Systems   Constitutional: Negative.   HENT: Negative.     Eyes: Negative.    Cardiovascular: Negative.    Respiratory: Negative.     Endocrine: Negative.    Hematologic/Lymphatic: Negative.    Skin: Negative.    Musculoskeletal: Negative.    Gastrointestinal: Negative.    Genitourinary: Negative.    Neurological: Negative.    Psychiatric/Behavioral: Negative.     Allergic/Immunologic: Negative.      14 point review of systems negative except as above.    Vitals:    04/20/23 1108   BP: (!) 160/82   BP Source: Arm, Left Upper   Pulse: 57   SpO2: 99%   O2 Device: None (Room air)   PainSc: Zero   Weight: 75.6 kg (166 lb 9.6 oz)   Height: 167.6 cm (5' 6)     Body mass index is 26.89 kg/m?Marland Kitchen     Physical Exam  General Appearance: no acute distress  HEENT: EOMI, mucous membranes moist, oropharynx is clear  Neck Veins: neck veins are flat & not distended  Carotid Arteries: no bruits  Chest Inspection: chest is normal in appearance  Auscultation/Percussion: lungs clear to auscultation, no rales, rhonchi, or wheezing  Cardiac Rhythm: regular rhythm & normal rate  Cardiac Auscultation: Normal S1 & S2, no S3 or S4, no rub  Murmurs: no cardiac murmurs  Abdominal Exam: soft, non-tender, normal bowel sounds, no masses or bruits  Abdominal aorta: nonpalpable   Liver & Spleen: no organomegaly  Extremities: no lower extremity edema; palpable distal pulses  Skin: warm & intact  Neurologic Exam: oriented to time, place and person; no focal neurologic deficits       Cardiovascular Studies  06/23/22   2D + DOPPLER ECHO   Result Value Ref Range    BSA 1.86 m2    Referring Provider Brenin Heidelberger     LVIDD 4.4 3.8 - 5.2 cm    LVIDS 2.5 2.2 - 3.5 cm    IVS 1.0 0.6 - 0.9 cm    PW 1.0 0.6 - 0.9 cm    FS 43.18 28 - 44 %    Teichholtz 73.41 %    LA size 3.9 2.7 - 3.8 cm    Left Ventricle Systolic Volume 30 14 - 42  mL    Left Ventricle Systolic Volume Index 16 8 - 24 mL/m2    Left Ventricle Diastolic Volume 65 46 - 106 mL    Left Ventricle Diastolic Volume Index 35 29 - 61 mL/m2    LA volume 61 22 - 52 mL    Left Atrium Index 32.80 16 - 34 mL/m2    LV mass 148 67 - 162 g    Left Ventricle Mass Index 79 43 - 95 g/m2    RWT 0.45 <=0.42    Right Ventricular Basal Diameter 4.2 2.5 - 4.1 cm    Right Atrial Area 18.0 <18 cm2    Right Ventricular Mid Diameter 2.6 1.9 - 3.5 cm    Right Heart Systolic Mmode TAPSE 1.6 >1.7 cm    Sinus 2.7 2.4 - 3.6 cm    Proximal aorta 2.9 1.9 - 3.5 cm    LVOT diameter 1.9 cm    LVOT area 2.84 cm2    LVOT peak VTI 18.0 cm    LVOT stroke volume 51.04 cm3    MV Comp diameter 1.9 cm    MV Comp area 2.84 cm2    MV Incomp diameter 3.2 cm    MV Incomp area 8.04 cm2    TR PEAK VELOCITY 2.5 m/s    RV SYSTOLIC PRESSURE 25     RA PRESSURE 3     TV rest pulmonary artery pressure 28 mmHg    TDI Medial e' 0.080 m/s    Medial E/E' ratio 12.50     TDI lateral e' 0.120 m/s    Lateral E/E' ratio 8.33     MV Peak E Vel PW 1.000 m/s    Right Heart Systolic TDI S' 0.1 m/s    SIMPSON'S BIPLANE EF 54 %    ECHO EF 55 %        Cardiovascular Health Factors  Vitals BP Readings from Last 3 Encounters:   04/20/23 (!) 160/82   04/11/23 134/59   02/28/23 (!) 181/67     Wt Readings from Last 3 Encounters:   04/20/23 75.6 kg (166 lb 9.6 oz)   04/11/23 73.9 kg (163 lb)   02/28/23 78 kg (172 lb)     BMI Readings from Last 3 Encounters:   04/20/23 26.89 kg/m?   04/11/23 26.31 kg/m?   02/28/23 27.76 kg/m?      Smoking Social History     Tobacco Use   Smoking Status Never   Smokeless Tobacco Never      Lipid Profile Cholesterol   Date Value Ref Range Status   02/02/2023 198  Final     HDL   Date Value Ref Range Status   02/02/2023 69  Final     LDL   Date Value Ref Range Status   02/02/2023 115 (H) <100 Final     Triglycerides   Date Value Ref Range Status   02/02/2023 71  Final      Blood Sugar No results found for: HGBA1C  Glucose   Date Value Ref Range Status   02/02/2023 84  Final   07/28/2022 89 70 - 100 MG/DL Final   98/03/9146 84 70 - 100 MG/DL Final        ASCVD Risk Assessment:     ASCVD 10-year risk calculated: The ASCVD Risk score (Arnett DK, et al., 2019) failed to calculate for the following reasons:    The 2019 ASCVD risk score is only valid for ages 42 to 71  LDL 70-189, if ASCVD 10-y risk is >7.5%, high to moderate-intensity statin therapy is recommended  Diabetes with ASCVD 10-y risk >7.5%, high-intensity statin therapy is recommended.  Diabetes with ASCVD 10-y risk <7.5%, moderate-intensity statin therapy is recommended.      Current Medications (including today's revisions)   alendronate (FOSAMAX) 70 mg tablet Take one tablet by mouth every 7 days.    bumetanide (BUMEX) 1 mg tablet Take one tablet to three tablets by mouth as directed. If weight continues to increase may take an additional 1 mg in AM so total of 2 mg in AM and 1 mg in afternoon for total daily dose of 3 mg    CALCIUM CITRATE PO Take 600 mg by mouth twice daily.    carvediloL (COREG) 6.25 mg tablet Take one tablet by mouth twice daily.    CHOLEcalciferoL (vitamin D3) (OPTIMAL D3) 50,000 units capsule Take one capsule by mouth every 7 days.    dofetilide (TIKOSYN) 500 mcg capsule Take one capsule by mouth every 12 hours. Indications: prevention of recurrent atrial fibrillation    ELIQUIS 5 mg tablet Take one tablet by mouth twice daily.    estradioL (CLIMARA) 0.025 mg/24 hr patch Apply one patch to top of skin as directed every 7 days.    Geriatric Multivit w/Iron-Min tab Take 1 tablet by mouth daily.    losartan (COZAAR) 50 mg tablet Take one tablet by mouth daily.    potassium chloride (KLOR-CON 10) 10 mEq tablet Take one tablet by mouth daily. When taking the Bumex    sodium chloride (SEA MIST) 0.65 % nasal spray Apply two sprays into nose as directed every 4 hours as needed.         Orpah Cobb MD  Cardiovascular Medicine.

## 2023-04-20 NOTE — Patient Instructions
Thank you for visiting our office today.    We would like to make the following medication adjustments:  NONE       Otherwise continue the same medications as you have been doing.          We will be pursuing the following tests after your appointment today:       Orders Placed This Encounter    ECG 12-LEAD         We will plan to see you back in 12 months.  Please call us in the meantime with any questions or concerns.        Please allow 5-7 business days for our providers to review your results. All normal results will go to MyChart. If you do not have Mychart, it is strongly recommended to get this so you can easily view all your results. If you do not have mychart, we will attempt to call you once with normal lab and testing results. If we cannot reach you by phone with normal results, we will send you a letter.  If you have not heard the results of your testing after one week please give us a call.       Your Cardiovascular Medicine [place] Team (Steve, Lisa, Jamie, Melanie, and Devaunte Gasparini)  phone number is [phone].

## 2023-05-08 ENCOUNTER — Encounter: Admit: 2023-05-08 | Discharge: 2023-05-08 | Payer: MEDICARE

## 2023-05-29 ENCOUNTER — Ambulatory Visit: Admit: 2023-05-29 | Discharge: 2023-05-30 | Payer: MEDICARE

## 2023-05-29 ENCOUNTER — Encounter: Admit: 2023-05-29 | Discharge: 2023-05-29 | Payer: MEDICARE

## 2023-05-31 ENCOUNTER — Encounter: Admit: 2023-05-31 | Discharge: 2023-05-31 | Payer: MEDICARE

## 2023-06-14 ENCOUNTER — Encounter: Admit: 2023-06-14 | Discharge: 2023-06-14 | Payer: MEDICARE

## 2023-06-14 ENCOUNTER — Ambulatory Visit: Admit: 2023-06-14 | Discharge: 2023-06-14 | Payer: MEDICARE

## 2023-06-14 NOTE — Progress Notes
Date of Service: 06/14/2023    Shalini Mair is a 83 y.o. female.       HPI     Ms.Ewton is here today for follow up of lower extremity symptoms.    She was diagnosed with right leg cellulitis in 1988 and since then has been experiencing fairly significant lower extremity swelling.  Symptoms slightly worsen at the end of the day.  She reports maybe a mild worsening of symptoms since 1988 but overall symptoms have been stable.  She denies any significant pain.  She currently wears compression consistently which she finds beneficial.  She previously attended lymphedema therapy at [facility] but felt it was cumbersome to do daily wrapping at home.    She reports mild left leg swelling that is particularly aggravated with atrial fibrillation.  Once that was diagnosed and treated she denies any symptoms of the right lower extremity.    History of previous venous intervention: Bilateral GSV stripping when she was 83 years old, cosmetic sclerotherapy 1970s     History of venous thrombotic disease: No     Family history of venous disease in the patient's: Yes, mother with complications of venous ulceration     Overall doing better compared to prior however does continue to have fairly significant right leg enlargement.  She reports that immediately after the use of the compression pump she notices a significant improvement of the swelling particularly at the lower leg and foot that gradually worsens throughout the day.  She has not noticed the same size reduction in the upper leg and thighs.  She is very consistent with compression stockings.  She was referred to lymphedema therapy however was only recommended 2 sessions and then discharged, no massage or wrapping was completed.         Vitals:    06/14/23 1315 06/14/23 1321   BP: (!) 172/59 (!) 162/56   BP Source: Arm, Right Upper Arm, Right Upper   Pulse: 52 53   Temp: 36.3 ?C (97.3 ?F)    TempSrc: Temporal    PainSc: Zero    Weight: 77.4 kg (170 lb 9.6 oz)    Height: 167.6 cm (5' 6)      Body mass index is 27.54 kg/m?Marland Kitchen     Past Medical History  Patient Active Problem List    Diagnosis Date Noted    Nocturnal hypoxemia 01/10/2023    OSA (obstructive sleep apnea) 01/10/2023     Home sleep apnea test 08/01/2022  pAHI 3% 22.7, pAHI 4% 15.9; time with saturation <88% 36 min. Lowest saturation 77.3%      Visit for monitoring Tikosyn therapy 07/25/2022    Hyperlipidemia, unspecified 06/14/2022    Hypertension 06/14/2022    Lymphedema 06/14/2022    Other chronic pain 06/14/2022    Vitamin D deficiency, unspecified 06/14/2022    Atrial fibrillation (HCC) 06/09/2022     06/23/22 - 2D + DOPPLER ECHO: Normal left ventricular cavitary dimensions with concentric remodeling. Normal left ventricular systolic function with a visually estimated ejection fraction of ~55-60 %.Normal right ventricular cavitary size with normal RV systolic function.Unable to determine LV diastolic dysfunction secondary to underlying atrial fibrillation.No left ventricular focal regional wall motion abnormality.No chamber enlargement. No hemodynamically significant valvular stenosis. Mild mitral, aortic and moderate tricuspid valve insufficiency. Estimated PA systolic pressure is 28 mmHgNo pericardial effusion.  07/26/22 - CARDIOVERSION, EXTERNAL: Successful DC cardioversion of atrial fibrillation to sinus rhythm.       Other screening mammogram 05/25/2007  ROS    Physical Exam   Vitals reviewed.  Musculoskeletal:      Right lower leg: Edema present.      Left lower leg: Edema present.      Comments: Right leg: There is 1+ pitting edema superimposed on nonpitting edema with involvement of the dorsum of the foot and toes, Stemmer sign positive, scattered clusters of spider telangiectasia on the upper thigh and varicose veins, no areas of erythema or tenderness to palpation    Left leg: There is trace pitting edema, Stemmer sign positive, scattered clusters of spider telangiectasia and varicose veins   Neurological: She is alert.   Skin: Skin is warm and dry. Capillary refill takes less than 2 seconds. No rash noted. No erythema. No pallor.     Cardiovascular Studies      Cardiovascular Health Factors  Vitals BP Readings from Last 3 Encounters:   06/14/23 (!) 162/56   04/20/23 (!) 160/82   04/11/23 134/59     Wt Readings from Last 3 Encounters:   06/14/23 77.4 kg (170 lb 9.6 oz)   04/20/23 75.6 kg (166 lb 9.6 oz)   04/11/23 73.9 kg (163 lb)     BMI Readings from Last 3 Encounters:   06/14/23 27.54 kg/m?   04/20/23 26.89 kg/m?   04/11/23 26.31 kg/m?      Smoking Social History     Tobacco Use   Smoking Status Never   Smokeless Tobacco Never      Lipid Profile Cholesterol   Date Value Ref Range Status   02/02/2023 198  Final     HDL   Date Value Ref Range Status   02/02/2023 69  Final     LDL   Date Value Ref Range Status   02/02/2023 115 (H) <100 Final     Triglycerides   Date Value Ref Range Status   02/02/2023 71  Final      Blood Sugar No results found for: HGBA1C  Glucose   Date Value Ref Range Status   02/02/2023 84  Final   07/28/2022 89 70 - 100 MG/DL Final   16/02/9603 84 70 - 100 MG/DL Final          Problems Addressed Today  Encounter Diagnoses   Name Primary?    Lymphedema Yes       Assessment and Plan     Ms. Chahal is an 83 year old female here today for lower extremity symptoms.  She was diagnosed with left leg cellulitis in 1988 and since then has been experiencing lower extremity symptoms.  She has a history of venous insufficiency and underwent bilateral GSV stripping when she was 83 years old and cosmetic sclerotherapy in 1970s.  On physical exam Right leg: There is 2+ pitting edema superimposed on nonpitting edema with involvement of the dorsum of the foot and toes, Stemmer sign positive, scattered clusters of spider telangiectasia on the upper thigh and varicose veins, no areas of erythema or tenderness to palpation. Left leg: There is trace pitting edema, Stemmer sign positive, scattered clusters of spider telangiectasia and varicose veins .     Symptoms appear to be most consistent with lymphedema.  She is very consistent with compression and also uses the compression pump daily.  She was referred to lymphedema therapy however I will was only recommended 2 sessions and no compression wrapping or massage was completed.  We compared her photos from last time to today's visit and there has been quite a significant improvement of  swelling.  However there is still some room to improve with compression.  I would like to refer her to [facility] physio at the [facility] location for assistance with higher strength compression and hopefully decongestive therapy as well.    I will have her follow-up in 6 months with her nurse practitioner Fredirick Maudlin.           Current Medications (including today's revisions)   alendronate (FOSAMAX) 70 mg tablet Take one tablet by mouth every 7 days.    bumetanide (BUMEX) 1 mg tablet Take one tablet to three tablets by mouth as directed. If weight continues to increase may take an additional 1 mg in AM so total of 2 mg in AM and 1 mg in afternoon for total daily dose of 3 mg    CALCIUM CITRATE PO Take 600 mg by mouth twice daily.    carvediloL (COREG) 6.25 mg tablet Take one tablet by mouth twice daily.    CHOLEcalciferoL (vitamin D3) (OPTIMAL D3) 50,000 units capsule Take one capsule by mouth every 7 days.    dofetilide (TIKOSYN) 500 mcg capsule Take one capsule by mouth every 12 hours. Indications: prevention of recurrent atrial fibrillation    ELIQUIS 5 mg tablet Take one tablet by mouth twice daily.    estradioL (CLIMARA) 0.025 mg/24 hr patch Apply one patch to top of skin as directed every 7 days.    Geriatric Multivit w/Iron-Min tab Take 1 tablet by mouth daily.    losartan (COZAAR) 50 mg tablet Take one tablet by mouth daily.    potassium chloride (KLOR-CON 10) 10 mEq tablet Take one tablet by mouth daily. When taking the Bumex    sodium chloride (SEA MIST) 0.65 % nasal spray Apply two sprays into nose as directed every 4 hours as needed.

## 2023-06-15 DIAGNOSIS — I890 Lymphedema, not elsewhere classified: Secondary | ICD-10-CM

## 2023-06-20 ENCOUNTER — Encounter: Admit: 2023-06-20 | Discharge: 2023-06-20 | Payer: MEDICARE

## 2023-06-20 NOTE — Progress Notes
Referral sent to [facility] for lymphedema therapy 06/19/23.    Fax Confirmation: YES

## 2023-07-16 ENCOUNTER — Encounter: Admit: 2023-07-16 | Discharge: 2023-07-16 | Payer: MEDICARE

## 2023-08-15 ENCOUNTER — Encounter: Admit: 2023-08-15 | Discharge: 2023-08-15

## 2023-08-15 NOTE — Telephone Encounter
 Received request from DME for provider to sign form necessary for billing. CMN for CPAP & supplies. Form completed and given to provider for signature.    Form will be faxed to  [employer].

## 2023-09-22 ENCOUNTER — Encounter: Admit: 2023-09-22 | Discharge: 2023-09-22 | Payer: MEDICARE

## 2023-09-24 ENCOUNTER — Encounter: Admit: 2023-09-24 | Discharge: 2023-09-24 | Payer: MEDICARE

## 2023-10-10 ENCOUNTER — Encounter: Admit: 2023-10-10 | Discharge: 2023-10-10 | Payer: MEDICARE

## 2023-10-18 ENCOUNTER — Encounter: Admit: 2023-10-18 | Discharge: 2023-10-18 | Payer: MEDICARE

## 2023-10-18 NOTE — Telephone Encounter
 An encounter has been created for documentation only (often for preparation of an upcoming appointment or for follow up on orders/imaging) and patient does not need contact RN and did not miss a phone call or appointment.     RN prepped patient information for clinic with Lilli Reil APRN NP on 12/19/23 at 1030 via in person appointment.     Here for 6 mo follow up  Dx:OSA  Pt known to sleep department by Dr. Florie Husband    Pt followed by cardiology for hx of atrial fib.     Last SS: 08/01/22 with AHI 15.9  Previous cpap 8-15     LV: 04/11/23 with Dr Dreama Gent   Meds:n/a    EAV:[id number]  RN obtained download from AV

## 2023-10-21 ENCOUNTER — Encounter: Admit: 2023-10-21 | Discharge: 2023-10-21 | Payer: MEDICARE

## 2023-11-23 ENCOUNTER — Encounter: Admit: 2023-11-23 | Discharge: 2023-11-23 | Payer: MEDICARE

## 2023-11-23 MED ORDER — BUMETANIDE 1 MG PO TAB
1-3 mg | ORAL_TABLET | ORAL | 3 refills | 30.00000 days | Status: AC
Start: 2023-11-23 — End: ?

## 2023-12-04 ENCOUNTER — Encounter: Admit: 2023-12-04 | Discharge: 2023-12-04 | Payer: MEDICARE

## 2023-12-04 MED ORDER — BUMETANIDE 1 MG PO TAB
1-3 mg | ORAL_TABLET | ORAL | 3 refills | 30.00000 days | Status: AC
Start: 2023-12-04 — End: ?

## 2023-12-06 ENCOUNTER — Encounter: Admit: 2023-12-06 | Discharge: 2023-12-06 | Payer: MEDICARE

## 2023-12-17 ENCOUNTER — Encounter: Admit: 2023-12-17 | Discharge: 2023-12-17 | Payer: MEDICARE

## 2023-12-18 NOTE — Progress Notes
 Date of Service: 12/19/2023    Subjective:             Susan Bailey is a 83 y.o. female.    History of Present Illness  I had the pleasure of seeing Susan Bailey in clinic for OSA follow-up. She is known to Dr. Mannie. She has moderate (AHI 15.9) and is on CPAP 12.  Wearing hybrid full face mask, N30i  Wanting to try the x30i when eligible   Pressure is comfortable  She sleeps good at night, does doze off in the afternoon sometimes     Having knee replacement surgery in November at [facility]         12/12/2023    11:17 PM 09/20/2023     9:11 AM 04/04/2023     1:36 PM 01/03/2023     2:31 PM   Epworth Sleepiness Scale   Sitting and reading 1 1 1 1    Watching TV 1 1 1 1    Sitting inactive in a public place (e.g. a theater or a meeting) 0 0 0 0   As a passenger in a car for an hour without a break 0 0 1 1   Lying down to rest in the afternoon when circumstances permit 3 3 3 3    Sitting and talking to someone 0 0 0 0   Sitting quietly after a lunch without alcohol 1 1 1  0   In a car, while stopped for a few minutes in traffic 0 0 0 0   Epworth Sleepiness Scale Score 6  6  7 6        Patient-reported                   Objective:         alendronate (FOSAMAX) 70 mg tablet Take one tablet by mouth every 7 days.    bumetanide  (BUMEX ) 1 mg tablet Take one tablet to three tablets by mouth as directed. If weight continues to increase may take an additional 1 mg in AM so total of 2 mg in AM and 1 mg in afternoon for total daily dose of 3 mg    CALCIUM CITRATE PO Take 600 mg by mouth twice daily.    carvediloL  (COREG ) 6.25 mg tablet Take one tablet by mouth twice daily.    CHOLEcalciferoL (vitamin D3) (OPTIMAL D3) 50,000 units capsule Take one capsule by mouth every 7 days.    dofetilide  (TIKOSYN ) 500 mcg capsule TAKE 1 CAPSULE EVERY 12 HOURS FOR PREVENTION OF RECURRENT ATRIAL FIBRILLATION    ELIQUIS  5 mg tablet TAKE 1 TABLET TWICE A DAY    estradioL (CLIMARA) 0.025 mg/24 hr patch Apply one patch to top of skin as directed every 7 days.    estradioL (ESTRACE) 0.01 % (0.1 mg/g) vaginal cream Insert or Apply one g to vaginal area.    Geriatric Multivit w/Iron-Min tab Take 1 tablet by mouth daily.    losartan  (COZAAR ) 50 mg tablet TAKE 1 TABLET BY MOUTH EVERY DAY    potassium chloride  (KLOR-CON  10) 10 mEq tablet Take one tablet by mouth daily. When taking the Bumex     sodium chloride  (SEA MIST) 0.65 % nasal spray Apply two sprays into nose as directed every 4 hours as needed.     Vitals:    12/19/23 1017   BP: (!) 146/56   BP Source: Arm, Left Upper   Pulse: 54   Temp: 36.6 ?C (97.9 ?F)   Resp: 18  SpO2: 100%   TempSrc: Oral   PainSc: Five   Weight: 75.3 kg (166 lb)   Height: 167.6 cm (5' 6)     Body mass index is 26.79 kg/m?[name]     Physical Exam  Vitals reviewed.   Constitutional:       Appearance: Normal appearance.   Pulmonary:      Effort: Pulmonary effort is normal. No respiratory distress.   Skin:     General: Skin is warm and dry.   Neurological:      Mental Status: She is alert and oriented to person, place, and time.   Psychiatric:         Mood and Affect: Mood normal.         Behavior: Behavior normal.         Thought Content: Thought content normal.         Judgment: Judgment normal.                Assessment and Plan:    Problem   Osa (Obstructive Sleep Apnea)    HST 08/01/2022  BMI 32.8 168lb  AHI (4%) 15.9 supine 65.2  O2 low 77%, time <88% 36 minutes    Pt set up with AutoPAP @ 5-15cm H2O on 01/31/23.  DME: [facility]  Insurance: Medicare  If the patient has to meet compliance, their compliance window will be from 03/03/23 to 05/01/23.           OSA (obstructive sleep apnea)  Reviewed CPAP download. 30/30 days of usage.   Wearing 4 hours or greater 100% of nights. The average use on days used was 6 hours and 16 minutes.  Residual AHI 11.0.    The median leak was 1.1 L/min. She is tolerating and benefiting from therapy.     - Will change pressure to 14 and check download in a week or so. Has a component of treatment induced centrals, so we discussed the probable need for inlab titration.     - Keep in contact with DME for routine supplies, need to contact us  directly if unable to communicate with DME in a timely manner.     She has our contact information and was encouraged to call us  with any questions or concerns.    RTC in 6 months    Orders Placed This Encounter    AMB REFERRAL TO HOME CARE     Future Appointments   Date Time Provider Department Center   05/28/2024  9:00 AM Elon Serene, MD [facility] CVM Exam   06/18/2024 11:00 AM Con Randall CROME, APRN-NP [facility]     Total time- 28 minutes

## 2023-12-19 ENCOUNTER — Encounter: Admit: 2023-12-19 | Discharge: 2023-12-19 | Payer: MEDICARE

## 2023-12-19 ENCOUNTER — Ambulatory Visit: Admit: 2023-12-19 | Discharge: 2023-12-20 | Payer: MEDICARE

## 2023-12-19 DIAGNOSIS — G4733 Obstructive sleep apnea (adult) (pediatric): Principal | ICD-10-CM

## 2023-12-19 NOTE — Patient Instructions
 If you have questions or concerns, please call my nurse at [phone] or feel free to send me a mychart message

## 2023-12-26 ENCOUNTER — Encounter: Admit: 2023-12-26 | Discharge: 2023-12-26 | Payer: MEDICARE

## 2023-12-26 NOTE — Telephone Encounter
 LOV 12/19/23 with Randall Benders and pressure changed to 14cm.

## 2023-12-26 NOTE — Telephone Encounter
-----   Message from Siletz E sent at 12/19/2023 10:57 AM CDT -----  Please get download in 1 week after pressure changed to 14 on 12/19/23

## 2023-12-27 ENCOUNTER — Encounter: Admit: 2023-12-27 | Discharge: 2023-12-27 | Payer: MEDICARE

## 2023-12-27 NOTE — Telephone Encounter
 Patient is anticipating knee surgery in November.  She is concerned due to possible interaction/complications with a spinal.  She wanted Dr Linette input of this issue.

## 2023-12-28 ENCOUNTER — Encounter: Admit: 2023-12-28 | Discharge: 2023-12-28 | Payer: MEDICARE

## 2023-12-28 NOTE — Telephone Encounter
 Order for pressure change to 14 cm H2O placed as discussed per office note on 12/19/23   DME: [facility]  Will continue to follow up.

## 2024-02-26 ENCOUNTER — Encounter: Admit: 2024-02-26 | Discharge: 2024-02-26 | Payer: MEDICARE

## 2024-02-26 NOTE — Telephone Encounter
 Received request from DME for provider to sign form necessary for billing. CMN for PAP supplies. Form completed and given to provider for signature.    Form will be faxed to [employer]

## 2024-03-04 ENCOUNTER — Encounter: Admit: 2024-03-04 | Discharge: 2024-03-04 | Payer: MEDICARE

## 2024-03-06 ENCOUNTER — Encounter: Admit: 2024-03-06 | Discharge: 2024-03-06 | Payer: MEDICARE

## 2024-04-01 ENCOUNTER — Encounter: Admit: 2024-04-01 | Discharge: 2024-04-01 | Payer: MEDICARE

## 2024-04-30 ENCOUNTER — Encounter: Admit: 2024-04-30 | Discharge: 2024-04-30 | Payer: MEDICARE

## 2024-04-30 NOTE — Telephone Encounter [36]
 An encounter has been created for documentation only (often for preparation of an upcoming appointment or for follow up on orders/imaging) and patient does not need contact RN and did not miss a phone call or appointment.     RN prepped patient information for clinic with Randall Benders APRN NP on 06/18/24 at 1100 via in person appointment.     Here for 6 mo follow up  Dx:OSA  Pt known to sleep department by Dr. 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

## 2024-05-20 ENCOUNTER — Encounter: Admit: 2024-05-20 | Discharge: 2024-05-20 | Payer: MEDICARE

## 2024-05-28 ENCOUNTER — Encounter: Admit: 2024-05-28 | Discharge: 2024-05-28 | Payer: MEDICARE

## 2024-05-28 VITALS — BP 169/68 | HR 53 | Ht 66.0 in | Wt 162.8 lb

## 2024-05-28 DIAGNOSIS — R0989 Other specified symptoms and signs involving the circulatory and respiratory systems: Principal | ICD-10-CM

## 2024-05-28 NOTE — Progress Notes [1]
 Cardiovascular Medicine       Date of Service: 05/28/2024      HPI     Susan Bailey is a 84 y.o. female who was seen today in the Cardiovascular Medicine Clinic at [facility] at our [place] office.      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     She presented to me in 2024 with fatigue and worsening lower extremity edema.  She was found to be in atrial fibrillation.  Given decreased functional status, I discussed cardioversion and antiarrhythmic initiation with her.  She agreed.  She was admitted for Tikosyn  initiation in March 2024, she was cardioverted after 2 doses which was initially successful, then she reverted back to atrial fibrillation.  EP planned to complete the 5 courses and then reattempt cardioversion, however after 3 doses she converted on her own and remained in sinus overnight and into the next day.        She is doing well today.  She states that her exercise tolerance is significantly improved since her cardioversion.  She was also diagnosed with sleep apnea and is currently on CPAP therapy, she is working with her sleep physician to find the appropriate mask for her.  She is recovering well from recent knee arthroplasty. Her lymphedema is improving with therapy, compression stockings and the use of wraps.   She does not have any known history of cardiac disease.  She had a coronary artery calcium score in October 2023 that was normal.           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     Transthoracic echocardiogram 06/2022:  Normal left ventricular cavitary dimensions with concentric remodeling  Normal left ventricular systolic function with a visually estimated ejection fraction of ~55-60 %.  Normal right ventricular cavitary size with normal RV systolic function.  Unable to determine LV diastolic dysfunction secondary to underlying atrial fibrillation.  No left ventricular focal regional wall motion abnormality.  No chamber enlargement.  No hemodynamically significant valvular stenosis  Mild mitral, aortic and moderate tricuspid valve insufficiency.  Estimated PA systolic pressure is 28 mmHg  No pericardial effusion.                                Assessment & Plan   84 y.o. female patient with the following medical problems:     Paroxysmal atrial fibrillation s/p cardioversion on Tikosyn  and apixaban .  Hypertension.  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  Lymphedema.     Continue dofetilide  and apixaban  for management of atrial fibrillation.  Lower extremity edema secondary to lymphedema makes assessment of volume status challenging but overall she appears well compensated.  Blood pressure is elevated today in clinic but she reports that at home is well-controlled, continue losartan  and carvedilol .  Continue use of lymphedema pumps, she follows with vascular clinic.  Mildly elevated LDL but overall low risk so will continue to monitor     Return to clinic in 1 year.             Past Medical History  Patient Active Problem List    Diagnosis Date Noted    Nocturnal hypoxemia 01/10/2023  OSA (obstructive sleep apnea) 01/10/2023     HST 08/01/2022  BMI 32.8 168lb  AHI (4%) 15.9 supine 65.2  O2 low 77%, time <88% 36 minutes    Pt set up with AutoPAP @ 5-15cm H2O on 01/31/23.  DME: [facility] / [facility]  Insurance: Medicare  If the patient has to meet compliance, their compliance window will be from 03/03/23 to 05/01/23.       Visit for monitoring Tikosyn  therapy 07/25/2022    Hyperlipidemia, unspecified 06/14/2022    Hypertension 06/14/2022    Lymphedema 06/14/2022    Other chronic pain 06/14/2022    Vitamin D deficiency, unspecified 06/14/2022    Atrial fibrillation (CMS-HCC) 06/09/2022     06/23/22 - 2D + DOPPLER ECHO: Normal left ventricular cavitary dimensions with concentric remodeling. Normal left ventricular systolic function with a visually estimated ejection fraction of ~55-60 %.Normal right ventricular cavitary size with normal RV systolic function.Unable to determine LV diastolic dysfunction secondary to underlying atrial fibrillation.No left ventricular focal regional wall motion abnormality.No chamber enlargement. No hemodynamically significant valvular stenosis. Mild mitral, aortic and moderate tricuspid valve insufficiency. Estimated PA systolic pressure is 28 mmHgNo pericardial effusion.  07/26/22 - CARDIOVERSION, EXTERNAL: Successful DC cardioversion of atrial fibrillation to sinus rhythm.       Other screening mammogram 05/25/2007       I reviewed and confirmed this patient's problem list, active medications, allergies, and past medical, social, family & tobacco histories.     Review of Systems  Review of Systems   Constitutional: Negative.   HENT: Negative.     Eyes: Negative.    Cardiovascular:  Positive for leg swelling.   Respiratory: Negative.     Endocrine: Negative.    Hematologic/Lymphatic: Negative.    Skin: Negative.    Musculoskeletal:  Positive for joint swelling.   Gastrointestinal: Negative.    Genitourinary: Negative.    Neurological: Negative.    Psychiatric/Behavioral: Negative.     Allergic/Immunologic: Negative.      14 point review of systems negative except as above.    Vitals:    05/28/24 0909   BP: (!) 169/68   BP Source: Arm, Left Upper   Pulse: 53   SpO2: 100%   O2 Device: None (Room air)   PainSc: Zero   Weight: 73.8 kg (162 lb 12.8 oz)   Height: 167.6 cm (5' 6)     Body mass index is 26.28 kg/m?[name]     Physical Exam  General Appearance: no acute distress  HEENT: EOMI, mucous membranes moist, oropharynx is clear  Neck Veins: neck veins are flat & not distended  Carotid Arteries: no bruits  Chest Inspection: chest is normal in appearance  Auscultation/Percussion: lungs clear to auscultation, no rales, rhonchi, or wheezing  Cardiac Rhythm: regular rhythm & normal rate  Cardiac Auscultation: Normal S1 & S2, no S3 or S4, no rub  Murmurs: no cardiac murmurs  Abdominal Exam: soft, non-tender, normal bowel sounds, no masses or bruits  Abdominal aorta: nonpalpable   Liver & Spleen: no organomegaly  Extremities: no lower extremity edema; palpable distal pulses  Skin: warm & intact  Neurologic Exam: oriented to time, place and person; no focal neurologic deficits       Cardiovascular Studies  06/23/22   2D + DOPPLER ECHO   Result Value Ref Range    BSA 1.86 m2    Referring Provider Jeniffer Culliver     LVIDD 4.4 3.8 - 5.2 cm    LVIDS 2.5 2.2 - 3.5  cm    IVS 1.0 0.6 - 0.9 cm    PW 1.0 0.6 - 0.9 cm    FS 43.18 28 - 44 %    Teichholtz 73.41 %    LA size 3.9 2.7 - 3.8 cm    Left Ventricle Systolic Volume 30 14 - 42 mL    Left Ventricle Systolic Volume Index 16 8 - 24 mL/m2    Left Ventricle Diastolic Volume 65 46 - 106 mL    Left Ventricle Diastolic Volume Index 35 29 - 61 mL/m2    LA volume 61 22 - 52 mL    Left Atrium Index 32.80 16 - 34 mL/m2    LV mass 148 67 - 162 g    Left Ventricle Mass Index 79 43 - 95 g/m2    RWT 0.45 <=0.42    Right Ventricular Basal Diameter 4.2 2.5 - 4.1 cm    Right Atrial Area 18.0 <18 cm2    Right Ventricular Mid Diameter 2.6 1.9 - 3.5 cm    Right Heart Systolic Mmode TAPSE 1.6 >1.7 cm    Sinus 2.7 2.4 - 3.6 cm    Proximal aorta 2.9 1.9 - 3.5 cm    LVOT diameter 1.9 cm    LVOT area 2.84 cm2    LVOT peak VTI 18.0 cm    LVOT stroke volume 51.04 cm3    MV Comp diameter 1.9 cm    MV Comp area 2.84 cm2    MV Incomp diameter 3.2 cm    MV Incomp area 8.04 cm2    TR PEAK VELOCITY 2.5 m/s    RV SYSTOLIC PRESSURE 25     RA PRESSURE 3     TV rest pulmonary artery pressure 28 mmHg    TDI Medial e' 0.080 m/s    Medial E/E' ratio 12.50     TDI lateral e' 0.120 m/s    Lateral E/E' ratio 8.33     MV Peak E Vel PW 1.000 m/s    Right Heart Systolic TDI S' 0.1 m/s    SIMPSON'S BIPLANE EF 54 %    ECHO EF 55 %        Cardiovascular Health Factors  Vitals BP Readings from Last 3 Encounters:   05/28/24 (!) 169/68   12/19/23 (!) 146/56   06/14/23 (!) 162/56     Wt Readings from Last 3 Encounters:   05/28/24 73.8 kg (162 lb 12.8 oz)   12/19/23 75.3 kg (166 lb)   06/14/23 77.4 kg (170 lb 9.6 oz)     BMI Readings from Last 3 Encounters:   05/28/24 26.28 kg/m?   12/19/23 26.79 kg/m?   06/14/23 27.54 kg/m?      Smoking Tobacco Use History[1]   Lipid Profile Cholesterol   Date Value Ref Range Status   02/27/2024 203 (H) <200 Final     HDL   Date Value Ref Range Status   02/27/2024 78  Final     LDL   Date Value Ref Range Status   02/27/2024 110 (H) <100 Final     Triglycerides   Date Value Ref Range Status   02/27/2024 76  Final      Blood Sugar No results found for: HGBA1C  Glucose   Date Value Ref Range Status   03/27/2024 96  Final   02/27/2024 88  Final   02/02/2023 84  Final        ASCVD Risk Assessment:     ASCVD 10-year risk  calculated: The ASCVD Risk score (Arnett DK, et al., 2019) failed to calculate for the following reasons:    The 2019 ASCVD risk score is only valid for ages 76 to 27    * - Cholesterol units were assumed     LDL 70-189, if ASCVD 10-y risk is >7.5%, high to moderate-intensity statin therapy is recommended  Diabetes with ASCVD 10-y risk >7.5%, high-intensity statin therapy is recommended.  Diabetes with ASCVD 10-y risk <7.5%, moderate-intensity statin therapy is recommended.      Current Medications (including today's revisions)   acetaminophen  (TYLENOL  EXTRA STRENGTH) 500 mg tablet Take one tablet by mouth twice daily.    alendronate (FOSAMAX) 70 mg tablet Take one tablet by mouth every 7 days. (Patient not taking: Reported on 05/28/2024)    bumetanide  (BUMEX ) 1 mg tablet Take one tablet to three tablets by mouth as directed. If weight continues to increase may take an additional 1 mg in AM so total of 2 mg in AM and 1 mg in afternoon for total daily dose of 3 mg (Patient taking differently: Take one tablet to three tablets by mouth as Needed. If weight continues to increase may take an additional 1 mg in AM so total of 2 mg in AM and 1 mg in afternoon for total daily dose of 3 mg)    CALCIUM CITRATE PO Take 600 mg by mouth twice daily.    carvediloL  (COREG ) 6.25 mg tablet Take one tablet by mouth twice daily.    CHOLEcalciferoL (vitamin D3) (OPTIMAL D3) 50,000 units capsule Take one capsule by mouth every 7 days.    dofetilide  (TIKOSYN ) 500 mcg capsule TAKE 1 CAPSULE EVERY 12 HOURS FOR PREVENTION OF RECURRENT ATRIAL FIBRILLATION    ELIQUIS  5 mg tablet TAKE 1 TABLET TWICE A DAY    estradioL (CLIMARA) 0.025 mg/24 hr patch Apply one patch to top of skin as directed every 7 days.    estradioL (ESTRACE) 0.01 % (0.1 mg/g) vaginal cream Insert or Apply one g to vaginal area as Needed.    Geriatric Multivit w/Iron-Min tab Take 1 tablet by mouth daily.    losartan  (COZAAR ) 50 mg tablet TAKE 1 TABLET BY MOUTH EVERY DAY    potassium chloride  (KLOR-CON  10) 10 mEq tablet Take one tablet by mouth daily. When taking the Bumex  (Patient taking differently: Take one tablet by mouth as Needed. When taking the Bumex )    sodium chloride  (SEA MIST) 0.65 % nasal spray Apply two sprays into nose as directed every 4 hours as needed.         Rosamond Boettcher MD  Cardiovascular Medicine.         [1]   Social History  Tobacco Use   Smoking Status Never   Smokeless Tobacco Never

## 2024-06-13 NOTE — Progress Notes [1]
 Date of Service: 06/18/2024    Subjective:             Susan Bailey is a 84 y.o. female.    History of Present Illness  I had the pleasure of seeing Susan Bailey in clinic for OSA follow-up. She is known to Dr. Mannie. She has moderate (AHI 15.9) and is on CPAP 14.  Wearing hybrid full face mask, X30i- has not received replacements for the mouth but has received the nasal pillow part   Pressure is comfortable  She sleeps good at night, does doze off in the afternoon sometimes after lunch   Reads before bed      Had knee replacement back in November, doing well, feeling a lot better   No other significant changes in her medical history in the last year     Currently has a stye in right eye        06/11/2024    10:35 AM 12/12/2023    11:17 PM 09/20/2023     9:11 AM 04/04/2023     1:36 PM 01/03/2023     2:31 PM   Epworth Sleepiness Scale   Sitting and reading 1  1  1  1  1     Watching TV 0  1  1  1  1     Sitting inactive in a public place (e.g. a theater or a meeting) 0  0  0  0  0    As a passenger in a car for an hour without a break 1  0  0  1  1    Lying down to rest in the afternoon when circumstances permit 3  3  3  3  3     Sitting and talking to someone 0  0  0  0  0    Sitting quietly after a lunch without alcohol 2  1  1  1   0    In a car, while stopped for a few minutes in traffic 0  0  0  0  0    Epworth Sleepiness Scale Score 7  6  6  7  6         Patient-reported                 Objective:        Objective    acetaminophen  (TYLENOL  EXTRA STRENGTH) 500 mg tablet Take one tablet by mouth twice daily.    alendronate (FOSAMAX) 70 mg tablet Take one tablet by mouth every 7 days. (Patient not taking: Reported on 06/18/2024)    bumetanide  (BUMEX ) 1 mg tablet Take one tablet to three tablets by mouth as directed. If weight continues to increase may take an additional 1 mg in AM so total of 2 mg in AM and 1 mg in afternoon for total daily dose of 3 mg (Patient taking differently: Take one tablet to three tablets by mouth as Needed. If weight continues to increase may take an additional 1 mg in AM so total of 2 mg in AM and 1 mg in afternoon for total daily dose of 3 mg)    CALCIUM CITRATE PO Take 600 mg by mouth twice daily.    carvediloL  (COREG ) 6.25 mg tablet Take one tablet by mouth twice daily.    CHOLEcalciferoL (vitamin D3) (OPTIMAL D3) 50,000 units capsule Take one capsule by mouth every 7 days.    dofetilide  (TIKOSYN ) 500 mcg capsule TAKE 1 CAPSULE EVERY 12 HOURS FOR  PREVENTION OF RECURRENT ATRIAL FIBRILLATION    ELIQUIS  5 mg tablet TAKE 1 TABLET TWICE A DAY    estradioL (CLIMARA) 0.025 mg/24 hr patch Apply one patch to top of skin as directed every 7 days.    estradioL (ESTRACE) 0.01 % (0.1 mg/g) vaginal cream Insert or Apply one g to vaginal area as Needed.    Geriatric Multivit w/Iron-Min tab Take 1 tablet by mouth daily.    losartan  (COZAAR ) 50 mg tablet TAKE 1 TABLET BY MOUTH EVERY DAY    potassium chloride  (KLOR-CON  10) 10 mEq tablet Take one tablet by mouth daily. When taking the Bumex  (Patient taking differently: Take one tablet by mouth as Needed. When taking the Bumex )    sodium chloride  (SEA MIST) 0.65 % nasal spray Apply two sprays into nose as directed every 4 hours as needed.     Vitals:    06/18/24 1057   BP: 138/55   BP Source: Arm, Left Upper   Pulse: 61   Temp: 36.6 ?C (97.9 ?F)   Resp: 18   SpO2: 100%   TempSrc: Oral   Weight: 73 kg (161 lb)   Height: 167.6 cm (5' 6)     Body mass index is 25.99 kg/m?[name]   Physical Exam  Vitals reviewed.   Constitutional:       Appearance: Normal appearance.   Pulmonary:      Effort: Pulmonary effort is normal. No respiratory distress.   Skin:     General: Skin is warm and dry.   Neurological:      Mental Status: She is alert and oriented to person, place, and time.   Psychiatric:         Mood and Affect: Mood normal.         Behavior: Behavior normal.         Thought Content: Thought content normal.         Judgment: Judgment normal.                 Assessment and Plan:    Problem   Osa (Obstructive Sleep Apnea)    HST 08/01/2022  BMI 32.8 168lb  AHI (4%) 15.9 supine 65.2  O2 low 77%, time <88% 36 minutes    Pt set up with AutoPAP @ 5-15cm H2O on 01/31/23.  DME: [facility]  Insurance: Medicare  If the patient has to meet compliance, their compliance window will be from 03/03/23 to 05/01/23.           OSA (obstructive sleep apnea)  Reviewed CPAP download. 29/30 days of usage.   Wearing 4 hours or greater 93% of nights. The average use on days used was 7 hours and 0 minutes.  Residual AHI 4.0.    The median leak was 1.3 L/min. She is tolerating and benefiting from therapy.     - Will lower back to 12 and see how she does with mask fit. I asked her to let me know if she wants to go back.     - Keep in contact with DME for routine supplies, need to contact us  directly if unable to communicate with DME in a timely manner.     She has our contact information and was encouraged to call us  with any questions or concerns.    RTC in one year    Orders Placed This Encounter    AMB REFERRAL TO HOME CARE     Total time- 20 minutes

## 2024-06-18 ENCOUNTER — Encounter: Admit: 2024-06-18 | Discharge: 2024-06-18 | Payer: MEDICARE

## 2024-06-18 ENCOUNTER — Ambulatory Visit: Admit: 2024-06-18 | Discharge: 2024-06-19 | Payer: MEDICARE

## 2024-06-18 DIAGNOSIS — G4733 Obstructive sleep apnea (adult) (pediatric): Principal | ICD-10-CM

## 2024-06-18 NOTE — Patient Instructions [37]
 If you have questions or concerns, please call my nurse at [phone] or feel free to send me a mychart message     To schedule or reschedule an appointment call [phone]     It was great to see you today, thank you!!
